# Patient Record
Sex: Female | Born: 1955 | Race: White | Hispanic: No | Marital: Single | Smoking: Former smoker
Health system: Southern US, Community
[De-identification: ages and names within clinical notes are randomized; demographics above are authoritative.]

## PROBLEM LIST (undated history)

## (undated) DIAGNOSIS — I10 Essential (primary) hypertension: Secondary | ICD-10-CM

## (undated) DIAGNOSIS — R7301 Impaired fasting glucose: Secondary | ICD-10-CM

## (undated) DIAGNOSIS — I739 Peripheral vascular disease, unspecified: Secondary | ICD-10-CM

## (undated) DIAGNOSIS — E7800 Pure hypercholesterolemia, unspecified: Secondary | ICD-10-CM

## (undated) HISTORY — DX: Peripheral vascular disease, unspecified: I73.9

## (undated) HISTORY — DX: Impaired fasting glucose: R73.01

---

## 1996-11-03 HISTORY — PX: HIP FRACTURE SURGERY: SHX118

## 2005-01-19 ENCOUNTER — Emergency Department (HOSPITAL_COMMUNITY): Admission: EM | Admit: 2005-01-19 | Discharge: 2005-01-19 | Payer: Self-pay | Admitting: Family Medicine

## 2010-03-20 ENCOUNTER — Emergency Department (HOSPITAL_COMMUNITY): Admission: EM | Admit: 2010-03-20 | Discharge: 2010-03-20 | Payer: Self-pay | Admitting: Family Medicine

## 2012-07-15 ENCOUNTER — Other Ambulatory Visit (HOSPITAL_COMMUNITY): Payer: Self-pay | Admitting: Family Medicine

## 2012-07-15 DIAGNOSIS — Z1231 Encounter for screening mammogram for malignant neoplasm of breast: Secondary | ICD-10-CM

## 2012-07-29 ENCOUNTER — Ambulatory Visit (HOSPITAL_COMMUNITY)
Admission: RE | Admit: 2012-07-29 | Discharge: 2012-07-29 | Disposition: A | Discharge status: Home / Self Care | Payer: Self-pay | Source: Ambulatory Visit | Attending: Family Medicine | Admitting: Family Medicine

## 2012-07-29 DIAGNOSIS — Z1231 Encounter for screening mammogram for malignant neoplasm of breast: Secondary | ICD-10-CM

## 2013-07-11 ENCOUNTER — Other Ambulatory Visit (HOSPITAL_COMMUNITY): Payer: Self-pay | Admitting: Family Medicine

## 2013-07-25 ENCOUNTER — Other Ambulatory Visit (HOSPITAL_COMMUNITY): Payer: Self-pay | Admitting: Family Medicine

## 2013-07-25 DIAGNOSIS — Z1231 Encounter for screening mammogram for malignant neoplasm of breast: Secondary | ICD-10-CM

## 2013-08-04 ENCOUNTER — Ambulatory Visit (HOSPITAL_COMMUNITY): Payer: Self-pay

## 2013-08-16 ENCOUNTER — Ambulatory Visit (HOSPITAL_COMMUNITY)
Admission: RE | Admit: 2013-08-16 | Discharge: 2013-08-16 | Disposition: A | Discharge status: Home / Self Care | Payer: Self-pay | Source: Ambulatory Visit | Attending: Family Medicine | Admitting: Family Medicine

## 2013-08-16 DIAGNOSIS — Z1231 Encounter for screening mammogram for malignant neoplasm of breast: Secondary | ICD-10-CM

## 2014-07-24 ENCOUNTER — Other Ambulatory Visit (HOSPITAL_COMMUNITY): Payer: Self-pay | Admitting: Family Medicine

## 2014-08-21 ENCOUNTER — Other Ambulatory Visit (HOSPITAL_COMMUNITY): Payer: Self-pay | Admitting: Family Medicine

## 2014-08-21 DIAGNOSIS — Z1231 Encounter for screening mammogram for malignant neoplasm of breast: Secondary | ICD-10-CM

## 2014-08-29 ENCOUNTER — Ambulatory Visit (HOSPITAL_COMMUNITY)
Admission: RE | Admit: 2014-08-29 | Discharge: 2014-08-29 | Disposition: A | Discharge status: Home / Self Care | Payer: Self-pay | Source: Ambulatory Visit | Attending: Family Medicine | Admitting: Family Medicine

## 2014-08-29 DIAGNOSIS — Z1231 Encounter for screening mammogram for malignant neoplasm of breast: Secondary | ICD-10-CM

## 2015-09-04 ENCOUNTER — Other Ambulatory Visit: Payer: Self-pay

## 2015-09-04 DIAGNOSIS — Z1231 Encounter for screening mammogram for malignant neoplasm of breast: Secondary | ICD-10-CM

## 2015-09-24 ENCOUNTER — Ambulatory Visit
Admission: RE | Admit: 2015-09-24 | Discharge: 2015-09-24 | Disposition: A | Discharge status: Home / Self Care | Payer: 59 | Source: Ambulatory Visit

## 2015-09-24 DIAGNOSIS — Z1231 Encounter for screening mammogram for malignant neoplasm of breast: Secondary | ICD-10-CM

## 2016-07-29 DIAGNOSIS — Z23 Encounter for immunization: Secondary | ICD-10-CM | POA: Diagnosis not present

## 2016-07-29 DIAGNOSIS — H539 Unspecified visual disturbance: Secondary | ICD-10-CM | POA: Diagnosis not present

## 2016-07-29 DIAGNOSIS — F17200 Nicotine dependence, unspecified, uncomplicated: Secondary | ICD-10-CM | POA: Diagnosis not present

## 2016-07-29 DIAGNOSIS — E7800 Pure hypercholesterolemia, unspecified: Secondary | ICD-10-CM | POA: Diagnosis not present

## 2016-07-29 DIAGNOSIS — I10 Essential (primary) hypertension: Secondary | ICD-10-CM | POA: Diagnosis not present

## 2016-08-26 ENCOUNTER — Other Ambulatory Visit: Payer: Self-pay | Admitting: Family Medicine

## 2016-08-26 DIAGNOSIS — Z1231 Encounter for screening mammogram for malignant neoplasm of breast: Secondary | ICD-10-CM

## 2016-09-24 ENCOUNTER — Ambulatory Visit: Payer: Self-pay

## 2016-10-21 ENCOUNTER — Ambulatory Visit
Admission: RE | Admit: 2016-10-21 | Discharge: 2016-10-21 | Disposition: A | Discharge status: Home / Self Care | Payer: Self-pay | Source: Ambulatory Visit | Attending: Family Medicine | Admitting: Family Medicine

## 2016-10-21 DIAGNOSIS — Z1231 Encounter for screening mammogram for malignant neoplasm of breast: Secondary | ICD-10-CM

## 2016-12-04 ENCOUNTER — Telehealth: Payer: Self-pay | Admitting: Acute Care

## 2016-12-05 NOTE — Telephone Encounter (Signed)
Will forward to the lung screening pool 

## 2016-12-30 NOTE — Telephone Encounter (Signed)
Per referral note 12/04/16 letters was sent to Dr Doran Stabler to notify that referral has been cancelled due to unable to contact pt to schedule appt.  A new referral will need to be placed if he still wants pt to be seen.

## 2017-07-29 DIAGNOSIS — E7800 Pure hypercholesterolemia, unspecified: Secondary | ICD-10-CM | POA: Diagnosis not present

## 2017-07-29 DIAGNOSIS — I10 Essential (primary) hypertension: Secondary | ICD-10-CM | POA: Diagnosis not present

## 2017-07-29 DIAGNOSIS — F17200 Nicotine dependence, unspecified, uncomplicated: Secondary | ICD-10-CM | POA: Diagnosis not present

## 2018-07-30 DIAGNOSIS — I10 Essential (primary) hypertension: Secondary | ICD-10-CM | POA: Diagnosis not present

## 2018-07-30 DIAGNOSIS — Z1211 Encounter for screening for malignant neoplasm of colon: Secondary | ICD-10-CM | POA: Diagnosis not present

## 2018-07-30 DIAGNOSIS — E7800 Pure hypercholesterolemia, unspecified: Secondary | ICD-10-CM | POA: Diagnosis not present

## 2018-07-30 DIAGNOSIS — F17200 Nicotine dependence, unspecified, uncomplicated: Secondary | ICD-10-CM | POA: Diagnosis not present

## 2019-05-09 ENCOUNTER — Other Ambulatory Visit: Payer: Self-pay | Admitting: Family Medicine

## 2019-05-09 DIAGNOSIS — Z1231 Encounter for screening mammogram for malignant neoplasm of breast: Secondary | ICD-10-CM

## 2019-06-10 ENCOUNTER — Other Ambulatory Visit: Payer: Self-pay

## 2019-06-10 ENCOUNTER — Encounter (HOSPITAL_COMMUNITY): Payer: Self-pay | Admitting: Emergency Medicine

## 2019-06-10 ENCOUNTER — Ambulatory Visit (HOSPITAL_COMMUNITY)
Admission: EM | Admit: 2019-06-10 | Discharge: 2019-06-10 | Disposition: A | Discharge status: Home / Self Care | Payer: Medicare Other | Attending: Family Medicine | Admitting: Family Medicine

## 2019-06-10 DIAGNOSIS — R1013 Epigastric pain: Secondary | ICD-10-CM

## 2019-06-10 HISTORY — DX: Essential (primary) hypertension: I10

## 2019-06-10 HISTORY — DX: Pure hypercholesterolemia, unspecified: E78.00

## 2019-06-10 LAB — COMPREHENSIVE METABOLIC PANEL
ALT: 15 U/L (ref 0–44)
AST: 20 U/L (ref 15–41)
Albumin: 4.4 g/dL (ref 3.5–5.0)
Alkaline Phosphatase: 67 U/L (ref 38–126)
Anion gap: 13 (ref 5–15)
BUN: 12 mg/dL (ref 8–23)
CO2: 26 mmol/L (ref 22–32)
Calcium: 9.7 mg/dL (ref 8.9–10.3)
Chloride: 96 mmol/L — ABNORMAL LOW (ref 98–111)
Creatinine, Ser: 0.77 mg/dL (ref 0.44–1.00)
GFR calc Af Amer: >60 mL/min (ref >60)
GFR calc non Af Amer: >60 mL/min (ref >60)
Glucose, Bld: 103 mg/dL — ABNORMAL HIGH (ref 70–99)
Potassium: 4.1 mmol/L (ref 3.5–5.1)
Sodium: 135 mmol/L (ref 135–145)
Total Bilirubin: 1 mg/dL (ref 0.3–1.2)
Total Protein: 7.2 g/dL (ref 6.5–8.1)

## 2019-06-10 LAB — CBC
HCT: 37.4 % (ref 36.0–46.0)
Hemoglobin: 12.4 g/dL (ref 12.0–15.0)
MCH: 29.9 pg (ref 26.0–34.0)
MCHC: 33.2 g/dL (ref 30.0–36.0)
MCV: 90.1 fL (ref 80.0–100.0)
Platelets: 233 10*3/uL (ref 150–400)
RBC: 4.15 MIL/uL (ref 3.87–5.11)
RDW: 12.9 % (ref 11.5–15.5)
WBC: 11.2 10*3/uL — ABNORMAL HIGH (ref 4.0–10.5)
nRBC: 0 % (ref 0.0–0.2)

## 2019-06-10 LAB — LIPASE, BLOOD: Lipase: 30 U/L (ref 11–51)

## 2019-06-10 MED ORDER — LIDOCAINE VISCOUS HCL 2 % MT SOLN
15.0000 mL | Freq: Once | OROMUCOSAL | Status: AC
Start: 2019-06-10 — End: 2019-06-10
  Administered 2019-06-10: 15 mL via ORAL

## 2019-06-10 MED ORDER — ALUM & MAG HYDROXIDE-SIMETH 200-200-20 MG/5ML PO SUSP
30.0000 mL | Freq: Once | ORAL | Status: AC
  Administered 2019-06-10: 30 mL via ORAL

## 2019-06-10 MED ORDER — LIDOCAINE VISCOUS HCL 2 % MT SOLN
OROMUCOSAL | Status: AC
  Filled 2019-06-10: qty 15

## 2019-06-10 MED ORDER — OMEPRAZOLE 20 MG PO CPDR: 20.0000 mg | DELAYED_RELEASE_CAPSULE | Freq: Every day | ORAL | 0 refills | Status: DC

## 2019-06-10 MED ORDER — ALUM & MAG HYDROXIDE-SIMETH 200-200-20 MG/5ML PO SUSP
ORAL | Status: AC
  Filled 2019-06-10: qty 30

## 2019-06-10 NOTE — Discharge Instructions (Signed)
Begin omeprazole daily for the next 1-2 weeks I will call if blood work abnormal Please contact primary care to discuss setting up outpatient ultrasound to evaluate your gallbladder If pain worsening developing fever or vomiting follow up in the emergency room

## 2019-06-10 NOTE — ED Triage Notes (Signed)
Pt sts takes 3 htn meds and 1 medication for cholesterol but doesn't know their names or have a list with here today

## 2019-06-10 NOTE — ED Triage Notes (Signed)
Pt here for abd pain x several days; pt thinks may be since starting taking new htn meds

## 2019-06-10 NOTE — ED Provider Notes (Signed)
[facility]    CSN: [id number] Arrival date & time: 06/10/19  1035     History   Chief Complaint Chief Complaint  Patient presents with  . Abdominal Pain    HPI Kathleen Moyer is a 63 y.o. female history of hypertension, hyperlipidemia presenting today for evaluation of abdominal pain.  Patient has had abdominal pain over the past 2 to 3 days.  Pain is located in her epigastrium.  Patient will radiate to the back and wakes her up out of sleep.  Pain is intermittent.  Pain currently at time of visit is 7 out of 10, at nighttime she reports it being 10 out of 10.  She denies associated nausea or vomiting.  Denies fevers.  Denies change in bowels including denying diarrhea or constipation.  Has daily regular bowel movements, skipped yesterday, but had a normal bowel movement this morning.  Denies associated chest pain or shortness of breath.  Denies associated URI symptoms of cough, congestion or sore throat.  Denies previous abdominal surgeries.  Patient denies frequent use of NSAIDs.  Denies regular alcohol intake.  She does note that she recently had alterations in her blood pressure medicine and was unsure if this was related to the medicine.  She does not know the names of the medicine she is on or were recently changed.  HPI  Past Medical History:  Diagnosis Date  . Hypercholesteremia   . Hypertension     There are no active problems to display for this patient.   History reviewed. No pertinent surgical history.  OB History   No obstetric history on file.      Home Medications    Prior to Admission medications   Medication Sig Start Date End Date Taking? Authorizing Provider  omeprazole (PRILOSEC) 20 MG capsule Take 1 capsule (20 mg total) by mouth daily. 06/10/19   Wieters, Elesa Hacker, PA-C    Family History No family history on file.  Social History Social History   Tobacco Use  . Smoking status: Current Every Day Smoker  . Smokeless tobacco: Never Used   Substance Use Topics  . Alcohol use: Yes    Frequency: Never  . Drug use: Never     Allergies   Patient has no known allergies.   Review of Systems Review of Systems  Constitutional: Negative for activity change, appetite change, chills, fatigue and fever.  HENT: Negative for congestion, ear pain, rhinorrhea, sinus pressure, sore throat and trouble swallowing.   Eyes: Negative for discharge and redness.  Respiratory: Negative for cough, chest tightness and shortness of breath.   Cardiovascular: Negative for chest pain.  Gastrointestinal: Positive for abdominal pain. Negative for diarrhea, nausea and vomiting.  Musculoskeletal: Negative for myalgias.  Skin: Negative for rash.  Neurological: Negative for dizziness, light-headedness and headaches.     Physical Exam Triage Vital Signs ED Triage Vitals [06/10/19 1158]  Enc Vitals Group     BP (!) 142/76     Pulse Rate 60     Resp 18     Temp 98.3 F (36.8 C)     Temp Source Oral     SpO2 98 %     Weight      Height      Head Circumference      Peak Flow      Pain Score 6     Pain Loc      Pain Edu?      Excl. in [place]?    No  data found.  Updated Vital Signs BP (!) 142/76 (BP Location: Right Arm)   Pulse 60   Temp 98.3 F (36.8 C) (Oral)   Resp 18   SpO2 98%   Visual Acuity Right Eye Distance:   Left Eye Distance:   Bilateral Distance:    Right Eye Near:   Left Eye Near:    Bilateral Near:     Physical Exam Vitals signs and nursing note reviewed.  Constitutional:      General: She is not in acute distress.    Appearance: She is well-developed.  HENT:     Head: Normocephalic and atraumatic.     Mouth/Throat:     Comments: Oral mucosa pink and moist, no tonsillar enlargement or exudate. Posterior pharynx patent and nonerythematous, no uvula deviation or swelling. Normal phonation. Eyes:     Conjunctiva/sclera: Conjunctivae normal.  Neck:     Musculoskeletal: Neck supple.  Cardiovascular:     Rate  and Rhythm: Normal rate and regular rhythm.     Heart sounds: No murmur.  Pulmonary:     Effort: Pulmonary effort is normal. No respiratory distress.     Breath sounds: Normal breath sounds.     Comments: Breathing comfortably at rest, CTABL, no wheezing, rales or other adventitious sounds auscultated Abdominal:     Palpations: Abdomen is soft.     Tenderness: There is abdominal tenderness.     Comments: Abdomen soft, nondistended, no previous surgical scars noted.  Tender to palpation in mid upper abdomen, epigastrium and right upper quadrant.  Negative rebound, negative Murphy's.  Skin:    General: Skin is warm and dry.  Neurological:     Mental Status: She is alert.      UC Treatments / Results  Labs (all labs ordered are listed, but only abnormal results are displayed) Labs Reviewed  CBC - Abnormal; Notable for the following components:      Result Value   WBC 11.2 (*)    All other components within normal limits  COMPREHENSIVE METABOLIC PANEL - Abnormal; Notable for the following components:   Chloride 96 (*)    Glucose, Bld 103 (*)    All other components within normal limits  LIPASE, BLOOD    EKG   Radiology No results found.  Procedures Procedures (including critical care time)  Medications Ordered in UC Medications  alum & mag hydroxide-simeth (MAALOX/MYLANTA) 200-200-20 MG/5ML suspension 30 mL (30 mLs Oral Given 06/10/19 1233)    And  lidocaine (XYLOCAINE) 2 % viscous mouth solution 15 mL (15 mLs Oral Given 06/10/19 1234)  alum & mag hydroxide-simeth (MAALOX/MYLANTA) 200-200-20 MG/5ML suspension (has no administration in time range)  lidocaine (XYLOCAINE) 2 % viscous mouth solution (has no administration in time range)    Initial Impression / Assessment and Plan / UC Course  I have reviewed the triage vital signs and the nursing notes.  Pertinent labs & imaging results that were available during my care of the patient were reviewed by me and considered in  my medical decision making (see chart for details).     Patient with upper abdominal tenderness.  GI cocktail provided with mild improvement in symptoms.  Patient symptoms concerning for gallbladder etiology given location right upper quadrant, awaking at nighttime.  Will initiate on omeprazole daily for the next 1 to 2 weeks.  Will obtain CBC, CMP and lipase, white count slightly elevated at 11.2.  Lipase normal.  Liver functions within normal limits.  Vital signs stable without fever or  tachycardia.  At this time do not suspect cholecystitis.  Will recommend outpatient follow-up with PCP for outpatient ultrasound of right upper quadrant.  Advised to follow-up in emergency room if pain worsening or progressing.Discussed strict return precautions. Patient verbalized understanding and is agreeable with plan.  Final Clinical Impressions(s) / UC Diagnoses   Final diagnoses:  Epigastric pain     Discharge Instructions     Begin omeprazole daily for the next 1-2 weeks I will call if blood work abnormal Please contact primary care to discuss setting up outpatient ultrasound to evaluate your gallbladder If pain worsening developing fever or vomiting follow up in the emergency room     ED Prescriptions    Medication Sig Dispense Auth. Provider   omeprazole (PRILOSEC) 20 MG capsule Take 1 capsule (20 mg total) by mouth daily. 14 capsule Wieters, Hallie C, PA-C     Controlled Substance Prescriptions [place] Controlled Substance Registry consulted? Not Applicable   Janith Lima, [place] 06/10/19 1519

## 2019-06-13 ENCOUNTER — Other Ambulatory Visit: Payer: Self-pay | Admitting: Family Medicine

## 2019-06-13 ENCOUNTER — Telehealth (HOSPITAL_COMMUNITY): Payer: Self-pay | Admitting: Emergency Medicine

## 2019-06-13 DIAGNOSIS — R109 Unspecified abdominal pain: Secondary | ICD-10-CM

## 2019-06-13 NOTE — Telephone Encounter (Signed)
Normal, Patient contacted and made aware of    results, all questions answered Has pcp appt on Wednesday for ultrasound.

## 2019-06-15 ENCOUNTER — Ambulatory Visit
Admission: RE | Admit: 2019-06-15 | Discharge: 2019-06-15 | Disposition: A | Discharge status: Home / Self Care | Payer: Medicare Other | Source: Ambulatory Visit | Attending: Family Medicine | Admitting: Family Medicine

## 2019-06-15 DIAGNOSIS — R109 Unspecified abdominal pain: Secondary | ICD-10-CM

## 2019-06-22 ENCOUNTER — Ambulatory Visit
Admission: RE | Admit: 2019-06-22 | Discharge: 2019-06-22 | Disposition: A | Discharge status: Home / Self Care | Payer: Medicare Other | Source: Ambulatory Visit | Attending: Family Medicine | Admitting: Family Medicine

## 2019-06-22 ENCOUNTER — Other Ambulatory Visit: Payer: Self-pay

## 2019-06-22 DIAGNOSIS — Z1231 Encounter for screening mammogram for malignant neoplasm of breast: Secondary | ICD-10-CM

## 2019-08-29 ENCOUNTER — Encounter: Payer: Self-pay | Admitting: Cardiology

## 2019-08-30 ENCOUNTER — Ambulatory Visit (INDEPENDENT_AMBULATORY_CARE_PROVIDER_SITE_OTHER): Payer: Medicare Other | Admitting: Cardiology

## 2019-08-30 ENCOUNTER — Other Ambulatory Visit: Payer: Self-pay

## 2019-08-30 ENCOUNTER — Encounter: Payer: Self-pay | Admitting: Cardiology

## 2019-08-30 VITALS — BP 180/70 | HR 53 | Ht 63.0 in | Wt 125.8 lb

## 2019-08-30 DIAGNOSIS — Z79899 Other long term (current) drug therapy: Secondary | ICD-10-CM

## 2019-08-30 DIAGNOSIS — I10 Essential (primary) hypertension: Secondary | ICD-10-CM

## 2019-08-30 DIAGNOSIS — E7800 Pure hypercholesterolemia, unspecified: Secondary | ICD-10-CM | POA: Diagnosis not present

## 2019-08-30 MED ORDER — SPIRONOLACTONE 25 MG PO TABS: 25.0000 mg | ORAL_TABLET | Freq: Every day | ORAL | 3 refills | Status: DC
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
## 2019-08-30 NOTE — Patient Instructions (Signed)
Medication Instructions:  Please start Spironolactone 25 mg once daily.  Continue all other medications as listed.  *If you need a refill on your cardiac medications before your next appointment, please call your pharmacy*  Lab Work: Please have blood work in approximately 1 week (BMP) If you have labs (blood work) drawn today and your tests are completely normal, you will receive your results only by: Marland Kitchen MyChart Message (if you have MyChart) OR . A paper copy in the mail If you have any lab test that is abnormal or we need to change your treatment, we will call you to review the results.  Testing/Procedures: Your physician has requested that you have a renal artery duplex. During this test, an ultrasound is used to evaluate blood flow to the kidneys. Allow one hour for this exam. Do not eat after midnight the day before and avoid carbonated beverages. Take your medications as you usually do.  You have been referred to the Hypertension Clinic.  Please schedule to establish with them 1 approximately 1 week.  Follow-Up: Follow up with Dr Marlou Porch will be determined after being treated in the Hypertension Clinic.  Thank you for choosing [facility]!!

## 2019-09-06 ENCOUNTER — Other Ambulatory Visit: Payer: Self-pay

## 2019-09-06 ENCOUNTER — Other Ambulatory Visit: Payer: Medicare Other

## 2019-09-06 ENCOUNTER — Ambulatory Visit: Payer: Medicare Other | Admitting: Pharmacist

## 2019-09-06 DIAGNOSIS — Z79899 Other long term (current) drug therapy: Secondary | ICD-10-CM

## 2019-09-06 DIAGNOSIS — I10 Essential (primary) hypertension: Secondary | ICD-10-CM

## 2019-09-06 LAB — BASIC METABOLIC PANEL
BUN/Creatinine Ratio: 17 (ref 12–28)
BUN: 14 mg/dL (ref 8–27)
CO2: 25 mmol/L (ref 20–29)
Calcium: 10.2 mg/dL (ref 8.7–10.3)
Chloride: 97 mmol/L (ref 96–106)
Creatinine, Ser: 0.82 mg/dL (ref 0.57–1.00)
GFR calc Af Amer: 87 mL/min/{1.73_m2} (ref >59)
GFR calc non Af Amer: 75 mL/min/{1.73_m2} (ref >59)
Glucose: 127 mg/dL — ABNORMAL HIGH (ref 65–99)
Potassium: 4.7 mmol/L (ref 3.5–5.2)
Sodium: 137 mmol/L (ref 134–144)

## 2019-09-06 NOTE — Progress Notes (Unsigned)
Patient ID: Kathleen Moyer                 DOB: October 01, 1956                      MRN: [id number]     HPI: Kathleen Moyer is a 63 y.o. female newly referred by Dr. Marlou Porch to HTN clinic. Started care with Dr. Marlou Porch last week. PMH is significant for HTN, HLD.   BP uncontrolled at last physician visit (10/27) despite 4 HTN medications. Side effects with previous medications; including abdominal pain with valsartan, and feet swelling with amlodipine (trialed twice). Lisinopril reportedly stopped for ineffectiveness in 2019.  Spironolact one recently started by Dr. Marlou Porch (10/27). BMP ordered prior to today's visit. Previously noted BPs: 180/70, 168/72, 142/76, but home BP average in July reportedly 155/80.  Scheduled for renal duplex tomorrow (11/4) to determine if HTN is of renovascular nature.   Tolerating spironolactone? Labs?  Abdominal pain with valsartan?  Increase spironolactone or hydralazine?   Current HTN meds: atenolol-chlorthalidone 100-25 mg daily, doxazosin 2 mg daily, hydralazine 25 mg twice a day, spironolactone 25 mg daily.  Previously tried: valsartan (abdominal pain), amlodipine (foot edema), lisinopril (infeffective).   BP goal: <130/80  Family History: CVA (father), HTN (brother, father),   Social History: smoker (<0.5 packs/day)  Diet:   Exercise:   Home BP readings:   Wt Readings from Last 3 Encounters:  08/30/19 125 lb 12.8 oz (57.1 kg)   BP Readings from Last 3 Encounters:  08/30/19 (!) 180/70  06/10/19 (!) 142/76   Pulse Readings from Last 3 Encounters:  08/30/19 (!) 53  06/10/19 60    Renal function: CrCl cannot be calculated (Patient's most recent lab result is older than the maximum 21 days allowed.).  Past Medical History:  Diagnosis Date  . Elevated fasting glucose   . Hypercholesteremia   . Hypertension     Current Outpatient Medications on File Prior to Visit  Medication Sig Dispense Refill  . atenolol-chlorthalidone (TENORETIC) 100-25 MG  tablet Take 1 tablet by mouth daily.    Marland Kitchen doxazosin (CARDURA) 2 MG tablet Take 2 mg by mouth daily.    . hydrALAZINE (APRESOLINE) 25 MG tablet Take 25 mg by mouth 2 (two) times daily.    . naproxen sodium (ALEVE) 220 MG tablet Take 220 mg by mouth 2 (two) times daily as needed. Take 2 tablets by mouth BID with food for pain and inflammation as needed    . omeprazole (PRILOSEC) 20 MG capsule Take 1 capsule (20 mg total) by mouth daily. 14 capsule 0  . simvastatin (ZOCOR) 40 MG tablet Take 40 mg by mouth daily.    Marland Kitchen spironolactone (ALDACTONE) 25 MG tablet Take 1 tablet (25 mg total) by mouth daily. 90 tablet 3   No current facility-administered medications on file prior to visit.     Allergies  Allergen Reactions  . Indocin [Indomethacin] Diarrhea and Nausea Only     Assessment/Plan:  1. Hypertension -

## 2019-09-07 ENCOUNTER — Telehealth: Payer: Self-pay | Admitting: *Deleted

## 2019-09-07 ENCOUNTER — Encounter (HOSPITAL_COMMUNITY): Payer: Medicare Other

## 2019-09-07 ENCOUNTER — Ambulatory Visit (HOSPITAL_COMMUNITY)
Admission: RE | Admit: 2019-09-07 | Discharge: 2019-09-07 | Disposition: A | Discharge status: Home / Self Care | Payer: Medicare Other | Source: Ambulatory Visit | Attending: Cardiology | Admitting: Cardiology

## 2019-09-07 DIAGNOSIS — I10 Essential (primary) hypertension: Secondary | ICD-10-CM | POA: Insufficient documentation

## 2019-09-07 DIAGNOSIS — Z79899 Other long term (current) drug therapy: Secondary | ICD-10-CM

## 2019-09-07 NOTE — Telephone Encounter (Signed)
Left message on VM to c/b to discuss the information below:   Notes recorded by Jerline Pain, MD on 09/07/2019 at 3:40 PM EST  Lets add telmisartan 40 mg once a day.  Repeat basic metabolic profile in 2 weeks.  Lets also set her up with the hypertension clinic in 2 weeks as well.  Candee Furbish, MD   Notes recorded by Antonieta Iba, RN on 09/07/2019 at 8:24 AM EST  The patient has been notified of the result and verbalized understanding.  BP Readings:  11/02: 167/69  11/03: 174/75  11/04: 161/74   This information can also be found on pt's lab results as well.

## 2019-09-08 MED ORDER — TELMISARTAN 40 MG PO TABS: 40.0000 mg | ORAL_TABLET | Freq: Every day | ORAL | 11 refills | Status: DC

## 2019-09-08 NOTE — Telephone Encounter (Signed)
Follow Up:   Pt is returning Pam Fleming's call from yesterday.

## 2019-09-08 NOTE — Telephone Encounter (Signed)
Pt aware of recommendations and agrees with plan Telmisartan 40 mg sent via epic to pt's pharmacy [facility]

## 2019-09-09 NOTE — Telephone Encounter (Signed)
Sister of the patient wanted clarification on the medications she is supposed to be taking. The sister is not sure if she is supposed to be taking the new medication in addition to all of the previously prescribed medications, or if the new medication will replace the old medications. The sister asked the patient and the patient forgot what the office had to say. Please call the sister for more clarification.

## 2019-09-13 NOTE — Telephone Encounter (Signed)
Called and spoke with pt regarding her sister's call and request for information.  Advised pt we do not have signed paperwork on file giving me permission to discuss pt's care with her.  Pt reports she does not have any questions.  Her sister was calling b/c she questions the necessary number of medication pt is having to take in order to control her blood pressure.  Pt reports she understands how to take her medications and her BP has been improving since starting Telmisartan daily.  11/8 BP was 131/66 and today was 147/71.  She will continue medications as ordered and f/u as scheduled.  She does have a DPR which has been filled out and will bring to her next appt.

## 2019-09-21 NOTE — Patient Instructions (Addendum)
Thank you for visiting [place] today!  Your blood pressure goal is <130/80 mmHg. Your blood pressure today was 145/70.  Medication Changes:  If your lab results look good today, we will increase your telmisartan to 80mg  once daily (you can take two of your 40mg  tablets until you pick up a new prescription for 80mg  tablets).  --We will call you with the results today or tomorrow.   Continue Atenolol-chlorthalidone (Tenoretic) 100-25 mg daily, Doxazosin 2 mg daily, Hydralazine 25 mg BID, Spironolactone 25 mg daily.   Continue to check your blood pressures at home at least 1-2 hours after you have taken you morning blood pressure medications.   Keep up the good work with diet and exercise. Try to increase walking to 15 minutes daily.   If you have any questions or concerns, please give [place] a call at [phone].

## 2019-09-21 NOTE — Progress Notes (Signed)
Patient ID: Kathleen Moyer                 DOB: 19-Feb-1956                      MRN: [id number]     HPI: Kathleen Moyer is a 63 y.o. female patient of Dr. Marisue Humble, referred by Dr. Marlou Porch to HTN clinic. PMH is significant for HTN, HLD, and tobacco abuse. She presented to the ED on 06/10/19 complaining of abdominal pain (7/10 during day, 10/10 at night) for several days, which she attributed to starting new HTN medications. Her BP then was 142/76, and HR was 60. She was ultimately discharged with no changes, and followed up with her PCP (Dr. Marisue Humble) who referred her to Dr. Marlou Porch. She saw Dr. Marlou Porch on 08/30/2019. He noted her last BP was 168/72, with averages in July of 155/80, however, she presented with BP of 180/70 and HR of 53. An EKG was ordered which showed sinus bradycardia but no other abnormalities. During that visit she was started on spironolactone 25 mg daily. However, on 11/4 via phone f/u, her home BP was noted to be elevated (167/69, 174/75, 161/74) and Dr. Marlou Porch added telmisartan 40 mg daily as well. A renal artery duplex was done on 11/4 to rule out secondary HTN, and found no stenosis or abnormalities.   Patient spoken with over the phone on 11/10 in regards to a call by her sister on 11/6 who was questioning which medications the patient should actually be taking for her BP, as it appears Ms. Bernarda Caffey was unsure what the directions were following the 11/4 phone call. The patient reported her BP had been improving since starting telmisartan daily, with a BP of 131/66 on 11/8, and 147/71 on 11/10.   Today, patient presents to clinic in good spirits. She provided home BP log (below), and values show improvements but still above goal. When she checks her home BP, she does it early in the morning before she takes her BP medications. She reports tolerating her medications well, including her new telmisartan. Denies any swelling, SOB, dizziness, or chest pain. She was able to correctly articulate  which medications she was taking and when. Her NSAID use is very infrequent, but has it in case of pain from hip bursitis issue. Her BP initially was 142/70, and 145/70 on recheck. Patient will be going for labs today, and preferred for [place] to schedule f/u when we call with results.  Current HTN meds:  Atenolol-chlorthalidone (Tenoretic) 100-25 mg daily (AM) Doxazosin 2 mg daily (PM) Hydralazine 25 mg BID (AM and PM) Spironolactone 25 mg daily (PM) Telmisartan 40 mg daily (PM)  Previously tried:  Valsartan 320mg  (abdominal pain) Amlodipine (feet swelling)  Lisinopril (ineffective in 2019)  BP goal: <130/80 mmHg  Family History: CVA in her father; Hypertension in her brother and father; Other in her mother. There is no history of Colon cancer or Liver disease.  Social History: Smoker (<1/2 pack/day)  Diet: Breakfast - eggs, bacon (sometimes), ham. Coffee (2-3 cups). Lunch - usually skips lunch) Supper - chicken nuggets, salads, not a lot of meat, doesn't cook a lot but doesn't eat out. No soda, mostly water.   Exercise: Not as active since retirement (2018), does walk through the house and outside.   Home BP readings: 414/67, 131/66, 158/69, 147/71, 140/67, 140/65, 134/66, 134/66, 127/67, 143/69, 147/70, 153/70. 145/69  Wt Readings from Last 3 Encounters:  08/30/19 125 lb 12.8 oz (57.1  kg)   BP Readings from Last 3 Encounters:  08/30/19 (!) 180/70  06/10/19 (!) 142/76   Pulse Readings from Last 3 Encounters:  08/30/19 (!) 53  06/10/19 60    Renal function: CrCl cannot be calculated (Unknown ideal weight.).  Past Medical History:  Diagnosis Date   Elevated fasting glucose    Hypercholesteremia    Hypertension     Current Outpatient Medications on File Prior to Visit  Medication Sig Dispense Refill   atenolol-chlorthalidone (TENORETIC) 100-25 MG tablet Take 1 tablet by mouth daily.     doxazosin (CARDURA) 2 MG tablet Take 2 mg by mouth daily.     hydrALAZINE  (APRESOLINE) 25 MG tablet Take 25 mg by mouth 2 (two) times daily.     naproxen sodium (ALEVE) 220 MG tablet Take 220 mg by mouth 2 (two) times daily as needed. Take 2 tablets by mouth BID with food for pain and inflammation as needed     omeprazole (PRILOSEC) 20 MG capsule Take 1 capsule (20 mg total) by mouth daily. 14 capsule 0   simvastatin (ZOCOR) 40 MG tablet Take 40 mg by mouth daily.     spironolactone (ALDACTONE) 25 MG tablet Take 1 tablet (25 mg total) by mouth daily. 90 tablet 3   telmisartan (MICARDIS) 40 MG tablet Take 1 tablet (40 mg total) by mouth daily. 30 tablet 11   No current facility-administered medications on file prior to visit.     Allergies  Allergen Reactions   Indocin [Indomethacin] Diarrhea and Nausea Only     Assessment/Plan:  1. Hypertension - Patient's blood pressure remains above goal of <130/80 mmHg, warranting additional medication optimization. Given tolerance to recent telmisartan addition, plan to increase telmisartan to 80 mg daily if BMET today is normal. Continue atenolol-chlorthalidone (Tenoretic) 100-25 mg daily (AM), doxazosin 2 mg daily (PM), hydralazine 25 mg BID (AM and PM), and spironolactone 25 mg daily (PM). Advised pt to continue checking blood pressure at home daily. Patient counseled on proper technique and timing relative to when she takes her BP medications. She was advised on ways to reduce salt intake and to reduce daily caffeine intake. Plan to call patient with results of her lab work and determine follow-up appointment at that time.   Claudina Lick, PharmD Candidate [phone] [place] [id number] [address]. [address], [place] [address] Phone: [phone]; Fax: [phone]

## 2019-09-22 ENCOUNTER — Ambulatory Visit (INDEPENDENT_AMBULATORY_CARE_PROVIDER_SITE_OTHER): Payer: Medicare Other | Admitting: Pharmacist

## 2019-09-22 ENCOUNTER — Telehealth: Payer: Self-pay | Admitting: Pharmacist

## 2019-09-22 ENCOUNTER — Other Ambulatory Visit: Payer: Self-pay

## 2019-09-22 ENCOUNTER — Other Ambulatory Visit: Payer: Medicare Other | Admitting: *Deleted

## 2019-09-22 VITALS — BP 142/70 | HR 53

## 2019-09-22 DIAGNOSIS — Z79899 Other long term (current) drug therapy: Secondary | ICD-10-CM

## 2019-09-22 DIAGNOSIS — I10 Essential (primary) hypertension: Secondary | ICD-10-CM | POA: Diagnosis not present

## 2019-09-22 LAB — BASIC METABOLIC PANEL
Anion gap: 10 (ref 5–15)
BUN: 10 mg/dL (ref 8–23)
CO2: 27 mmol/L (ref 22–32)
Calcium: 9.6 mg/dL (ref 8.9–10.3)
Chloride: 98 mmol/L (ref 98–111)
Creatinine, Ser: 0.7 mg/dL (ref 0.44–1.00)
GFR calc Af Amer: >60 mL/min (ref >60)
GFR calc non Af Amer: >60 mL/min (ref >60)
Glucose, Bld: 135 mg/dL — ABNORMAL HIGH (ref 70–99)
Potassium: 4.2 mmol/L (ref 3.5–5.1)
Sodium: 135 mmol/L (ref 135–145)

## 2019-09-22 MED ORDER — TELMISARTAN 80 MG PO TABS: 80.0000 mg | ORAL_TABLET | Freq: Every day | ORAL | 11 refills | Status: DC

## 2019-09-22 NOTE — Telephone Encounter (Signed)
BMET stable, will increase telmisartan to 80mg  daily and continue other HTN medications. Pt is aware of med change. Will f/u in HTN clinic in 3 weeks.

## 2019-10-13 ENCOUNTER — Ambulatory Visit (INDEPENDENT_AMBULATORY_CARE_PROVIDER_SITE_OTHER): Payer: Medicare Other | Admitting: Pharmacist

## 2019-10-13 ENCOUNTER — Other Ambulatory Visit: Payer: Self-pay

## 2019-10-13 ENCOUNTER — Encounter (INDEPENDENT_AMBULATORY_CARE_PROVIDER_SITE_OTHER): Payer: Self-pay

## 2019-10-13 VITALS — BP 152/68 | HR 58

## 2019-10-13 DIAGNOSIS — I10 Essential (primary) hypertension: Secondary | ICD-10-CM

## 2019-10-13 LAB — BASIC METABOLIC PANEL
BUN/Creatinine Ratio: 23 (ref 12–28)
BUN: 19 mg/dL (ref 8–27)
CO2: 24 mmol/L (ref 20–29)
Calcium: 10.1 mg/dL (ref 8.7–10.3)
Chloride: 97 mmol/L (ref 96–106)
Creatinine, Ser: 0.82 mg/dL (ref 0.57–1.00)
GFR calc Af Amer: 87 mL/min/{1.73_m2} (ref >59)
GFR calc non Af Amer: 75 mL/min/{1.73_m2} (ref >59)
Glucose: 117 mg/dL — ABNORMAL HIGH (ref 65–99)
Potassium: 4.2 mmol/L (ref 3.5–5.2)
Sodium: 137 mmol/L (ref 134–144)

## 2019-10-13 MED ORDER — HYDRALAZINE HCL 50 MG PO TABS: 50.0000 mg | ORAL_TABLET | Freq: Three times a day (TID) | ORAL | 11 refills | Status: DC

## 2019-10-13 MED ORDER — ATENOLOL-CHLORTHALIDONE 100-25 MG PO TABS: 1.0000 | ORAL_TABLET | Freq: Every day | ORAL | 3 refills | Status: DC

## 2019-10-13 MED ORDER — TELMISARTAN 80 MG PO TABS: 80.0000 mg | ORAL_TABLET | Freq: Every day | ORAL | 3 refills | Status: DC

## 2019-10-13 MED ORDER — SIMVASTATIN 40 MG PO TABS: 40.0000 mg | ORAL_TABLET | Freq: Every day | ORAL | 3 refills | Status: DC

## 2019-10-13 NOTE — Progress Notes (Signed)
Patient ID: Kathleen Moyer                 DOB: [birth year]                      MRN: [id number]     HPI: Kathleen Moyer is a 63 y.o. female patient of Dr. Marisue Humble, referred by Dr. Marlou Porch to HTN clinic. PMH is significant for HTN, HLD, and tobacco abuse. She presented to the ED on 06/10/19 complaining of abdominal pain (7/10 during day, 10/10 at night) for several days, which she attributed to starting new HTN medications. Her BP then was 142/76, and HR was 60. She was ultimately discharged with no changes, and followed up with her PCP (Dr. Marisue Humble) who referred her to Dr. Marlou Porch. She saw Dr. Marlou Porch on 08/30/2019. He noted her last BP was 168/72, with averages in July of 155/80, however, she presented with BP of 180/70 and HR of 53. An EKG was ordered which showed sinus bradycardia but no other abnormalities. During that visit she was started on spironolactone 25 mg daily. However, on 11/4 via phone f/u, her home BP was noted to be elevated (167/69, 174/75, 161/74) and Dr. Marlou Porch added telmisartan 40 mg daily as well. A renal artery duplex was done on 11/4 to rule out secondary HTN, and found no stenosis or abnormalities. At last visit in HTN clinic on 11/19, BP remained elevated at 142/70, BMET was stable, and telmisartan was increased to 80mg .  Pt presents today in good spirits. She provided home BP log (below), and values show improvements with about 50% of home readings at goal. She has started checking her BP readings a few hours after taking her medications. She reports tolerating her medications well, including her new telmisartan. Denies any dizziness, headaches, falls, or vision changes. Her NSAID use is very infrequent, but has it in case of pain from hip bursitis issue.  She states her BP usually runs higher in clinic. States she has brought her cuff to her PCP previously and the readings matched well.  Current HTN meds:  Atenolol-chlorthalidone (Tenoretic) 100-25 mg daily (AM)  Doxazosin 2 mg daily (PM) Hydralazine 25 mg BID (AM and PM) Spironolactone 25 mg daily (PM) Telmisartan 80 mg daily (PM)  Previously tried:  Valsartan 320mg  (abdominal pain) Amlodipine (feet swelling)  Lisinopril (ineffective in 2019)  BP goal: <130/80 mmHg  Family History: CVA in her father; Hypertension in her brother and father; Other in her mother. There is no history of Colon cancer or Liver disease.  Social History: Smoker (<1/2 pack/day)  Diet: Breakfast - eggs, bacon (sometimes), ham. Coffee (2-3 cups). Lunch - usually skips lunch) Supper - chicken nuggets, salads, not a lot of meat, doesn't cook a lot but doesn't eat out. No soda, mostly water.   Exercise: Not as active since retirement (2018), does walk through the house and outside.   Home BP readings: >50% of readings at home are at goal. High of 146/66, low of 106/62, felt ok during that time Wt Readings from Last 3 Encounters:  08/30/19 125 lb 12.8 oz (57.1 kg)   BP Readings from Last 3 Encounters:  09/22/19 (!) 142/70  08/30/19 (!) 180/70  06/10/19 (!) 142/76   Pulse Readings from Last 3 Encounters:  09/22/19 (!) 53  08/30/19 (!) 53  06/10/19 60    Renal function: CrCl cannot be calculated (Patient's most recent lab result is older than the maximum 21 days allowed.).  Past  Medical History:  Diagnosis Date  . Elevated fasting glucose   . Hypercholesteremia   . Hypertension     Current Outpatient Medications on File Prior to Visit  Medication Sig Dispense Refill  . atenolol-chlorthalidone (TENORETIC) 100-25 MG tablet Take 1 tablet by mouth daily.    Marland Kitchen doxazosin (CARDURA) 2 MG tablet Take 2 mg by mouth daily.    . hydrALAZINE (APRESOLINE) 25 MG tablet Take 25 mg by mouth 2 (two) times daily.    . naproxen sodium (ALEVE) 220 MG tablet Take 220 mg by mouth 2 (two) times daily as needed. Take 2 tablets by mouth BID with food for pain and inflammation as needed    . omeprazole (PRILOSEC) 20 MG capsule Take  1 capsule (20 mg total) by mouth daily. 14 capsule 0  . simvastatin (ZOCOR) 40 MG tablet Take 40 mg by mouth daily.    Marland Kitchen spironolactone (ALDACTONE) 25 MG tablet Take 1 tablet (25 mg total) by mouth daily. 90 tablet 3  . telmisartan (MICARDIS) 80 MG tablet Take 1 tablet (80 mg total) by mouth daily. 30 tablet 11   No current facility-administered medications on file prior to visit.    Allergies  Allergen Reactions  . Indocin [Indomethacin] Diarrhea and Nausea Only     Assessment/Plan:  1. Hypertension - Clinic reading above goal <130/39mmHg and about 50% of home readings above goal. Checking BMET today with recent dose increase of telmisartan. Will stop doxazosin to consolidate medications and increase hydralazine from 25mg  BID to 50mg  TID (advised pt she can take 50mg  BID for the next few days to ensure she tolerates higher dose ok). Continue atenolol-chlorthalidone 100-25 mg daily, telmisartan 80mg  daily, and spironolactone 25 mg daily. Advised pt to continue checking blood pressure at home daily. F/u in HTN clinic in 4 weeks.  Megan E. Supple, PharmD, BCACP, [place] [id number] [address]. [address], [place], [place] [address] Phone: [phone]; Fax: [phone] 10/13/2019 11:25 AM

## 2019-10-13 NOTE — Patient Instructions (Addendum)
It was nice to see you today  STOP taking doxazosin  INCREASE your hydralazine to 50mg  - you can take this 2 times a day for the next few days to make sure you tolerate it ok, then increase it to 3 times a day  Continue taking your other medications  Continue to monitor and record your blood pressure readings at home. Please bring these with you to your next visit in 4 weeks  If you have any questions, call Megan at [phone]

## 2019-11-10 ENCOUNTER — Other Ambulatory Visit: Payer: Self-pay

## 2019-11-10 ENCOUNTER — Ambulatory Visit (INDEPENDENT_AMBULATORY_CARE_PROVIDER_SITE_OTHER): Payer: Medicare Other | Admitting: Pharmacist

## 2019-11-10 VITALS — BP 130/72 | HR 61

## 2019-11-10 DIAGNOSIS — I10 Essential (primary) hypertension: Secondary | ICD-10-CM

## 2019-11-10 MED ORDER — SPIRONOLACTONE 25 MG PO TABS: 25.0000 mg | ORAL_TABLET | Freq: Every day | ORAL | 0 refills | Status: DC

## 2019-11-10 MED ORDER — HYDRALAZINE HCL 50 MG PO TABS: 50.0000 mg | ORAL_TABLET | Freq: Three times a day (TID) | ORAL | 3 refills | Status: DC

## 2019-11-10 MED ORDER — HYDRALAZINE HCL 50 MG PO TABS: 50.0000 mg | ORAL_TABLET | Freq: Three times a day (TID) | ORAL | 0 refills | Status: DC

## 2019-11-10 MED ORDER — ATENOLOL-CHLORTHALIDONE 100-25 MG PO TABS: 1.0000 | ORAL_TABLET | Freq: Every day | ORAL | 3 refills | Status: DC

## 2019-11-10 MED ORDER — SPIRONOLACTONE 25 MG PO TABS: 25.0000 mg | ORAL_TABLET | Freq: Every day | ORAL | 3 refills | Status: DC

## 2019-11-10 MED ORDER — TELMISARTAN 80 MG PO TABS: 80.0000 mg | ORAL_TABLET | Freq: Every day | ORAL | 3 refills | Status: DC

## 2019-11-10 NOTE — Progress Notes (Signed)
Patient ID: Kathleen Moyer                 DOB: October 20, 1956                      MRN: [id number]     HPI: Kathleen Moyer is a 64 y.o. female patient of Dr. Marisue Humble, referred by Dr. Marlou Porch to HTN clinic. PMH is significant for HTN, HLD, and tobacco abuse. She presented to the ED on 06/10/19 complaining of abdominal pain (7/10 during day, 10/10 at night) for several days, which she attributed to starting new HTN medications. Her BP then was 142/76, and HR was 60. She was ultimately discharged with no changes, and followed up with her PCP (Dr. Marisue Humble) who referred her to Dr. Marlou Porch. She saw Dr. Marlou Porch on 08/30/2019. He noted her last BP was 168/72, with averages in July of 155/80, however, she presented with BP of 180/70 and HR of 53. An EKG was ordered which showed sinus bradycardia but no other abnormalities. During that visit she was started on spironolactone 25 mg daily. However, on 11/4 via phone f/u, her home BP was noted to be elevated (167/69, 174/75, 161/74) and Dr. Marlou Porch added telmisartan 40 mg daily as well. A renal artery duplex was done on 11/4 to rule out secondary HTN, and found no stenosis or abnormalities. At last visit in HTN clinic on 12/10, BP remained elevated at 152/68, low dose doxazosin was stopped to consolidate meds and hydralazine was increased to 50mg  TID.  Pt presents today in good spirits. She reports tolerating her medication changes well. Denies dizziness, headaches, vision changes, falls, or LEE. She checks her BP at 8am a few hours after taking her AM medications. Home readings - 119/59 - 161/77, most ranging 110-120s/70s, HR mostly 60s. Still smoking 1/2 PPD. Her NSAID use is very infrequent, but has it in case of pain from hip bursitis issue.  She states her BP usually runs higher in clinic. States she has brought her cuff to her PCP previously and the readings matched well.  Current HTN meds:  Atenolol-chlorthalidone (Tenoretic) 100-25 mg daily (AM) Hydralazine  50mg  TID Spironolactone 25 mg daily (PM) Telmisartan 80 mg daily (PM)  Previously tried:  Valsartan 320mg  (abdominal pain) Amlodipine (feet swelling)  Lisinopril (ineffective in 2019) Doxazosin 2mg  (stopped to consolidate meds)  BP goal: <130/80 mmHg  Family History: CVA in her father; Hypertension in her brother and father; Other in her mother. There is no history of Colon cancer or Liver disease.  Social History: Smoker (<1/2 pack/day)  Diet: Breakfast - eggs, bacon (sometimes), ham. Coffee (2-3 cups). Lunch - usually skips lunch) Supper - chicken nuggets, salads, not a lot of meat, doesn't cook a lot but doesn't eat out. No soda, mostly water.   Exercise: Not as active since retirement (2018), does walk through the house and outside.   Home BP readings: >50% of readings at home are at goal. High of 146/66, low of 106/62, felt ok during that time Wt Readings from Last 3 Encounters:  08/30/19 125 lb 12.8 oz (57.1 kg)   BP Readings from Last 3 Encounters:  10/13/19 (!) 152/68  09/22/19 (!) 142/70  08/30/19 (!) 180/70   Pulse Readings from Last 3 Encounters:  10/13/19 (!) 58  09/22/19 (!) 53  08/30/19 (!) 53    Renal function: CrCl cannot be calculated (Patient's most recent lab result is older than the maximum 21 days allowed.).  Past Medical  History:  Diagnosis Date  . Elevated fasting glucose   . Hypercholesteremia   . Hypertension     Current Outpatient Medications on File Prior to Visit  Medication Sig Dispense Refill  . atenolol-chlorthalidone (TENORETIC) 100-25 MG tablet Take 1 tablet by mouth daily. 90 tablet 3  . hydrALAZINE (APRESOLINE) 50 MG tablet Take 1 tablet (50 mg total) by mouth 3 (three) times daily. 90 tablet 11  . naproxen sodium (ALEVE) 220 MG tablet Take 220 mg by mouth 2 (two) times daily as needed. Take 2 tablets by mouth BID with food for pain and inflammation as needed    . omeprazole (PRILOSEC) 20 MG capsule Take 1 capsule (20 mg total) by  mouth daily. 14 capsule 0  . simvastatin (ZOCOR) 40 MG tablet Take 1 tablet (40 mg total) by mouth daily. 90 tablet 3  . spironolactone (ALDACTONE) 25 MG tablet Take 1 tablet (25 mg total) by mouth daily. 90 tablet 3  . telmisartan (MICARDIS) 80 MG tablet Take 1 tablet (80 mg total) by mouth daily. 90 tablet 3   No current facility-administered medications on file prior to visit.    Allergies  Allergen Reactions  . Indocin [Indomethacin] Diarrhea and Nausea Only     Assessment/Plan:  1. Hypertension - BP has improved and is now at goal <130/56mmHg in the office and with most home readings. Will continue hydralazine 50mg  TID, atenolol-chlorthalidone 100-25 mg daily, telmisartan 80mg  daily, and spironolactone 25 mg daily. Advised pt to continue checking blood pressure at home daily and call clinic with any concerns. F/u in HTN clinic as needed.  Milanya Sunderland E. Tanayah Squitieri, PharmD, BCACP, [place] [id number] [address]. [address], [place], [place] [address] Phone: [phone]; Fax: [phone] 11/10/2019 7:49 AM

## 2019-11-10 NOTE — Patient Instructions (Addendum)
It was nice to see you today  Your blood pressure is at goal is less than 130/59mmHg  Continue taking your atenolol-chlorthalidone (Tenoretic) once daily, hydralazine 3x a day, spironolactone once daily, and telmisartan once daily for your blood pressure  Try to cut back on smoking  Continue to monitor your blood pressure at home. Call clinic with any concerns

## 2019-12-14 ENCOUNTER — Telehealth: Payer: Self-pay | Admitting: Pharmacist

## 2019-12-14 NOTE — Telephone Encounter (Signed)
Patient called clinic stating that her blood pressure has been running low and she feels bad. She also has had some dizzy spells. Reports SBP 110, 106, 111. I have advised patient to decrease hydralazine to 25mg  three times a day. Instructed her to call back if no improvement is seen.

## 2020-04-22 ENCOUNTER — Other Ambulatory Visit: Payer: Self-pay | Admitting: Cardiology

## 2020-07-10 ENCOUNTER — Other Ambulatory Visit: Payer: Self-pay | Admitting: Family Medicine

## 2020-07-10 DIAGNOSIS — Z1231 Encounter for screening mammogram for malignant neoplasm of breast: Secondary | ICD-10-CM

## 2020-07-25 ENCOUNTER — Ambulatory Visit
Admission: RE | Admit: 2020-07-25 | Discharge: 2020-07-25 | Disposition: A | Discharge status: Home / Self Care | Payer: Medicare Other | Source: Ambulatory Visit | Attending: Family Medicine | Admitting: Family Medicine

## 2020-07-25 ENCOUNTER — Other Ambulatory Visit: Payer: Self-pay

## 2020-07-25 DIAGNOSIS — Z1231 Encounter for screening mammogram for malignant neoplasm of breast: Secondary | ICD-10-CM

## 2020-08-10 ENCOUNTER — Other Ambulatory Visit: Payer: Self-pay | Admitting: Family Medicine

## 2020-08-10 DIAGNOSIS — R748 Abnormal levels of other serum enzymes: Secondary | ICD-10-CM

## 2020-08-21 ENCOUNTER — Ambulatory Visit
Admission: RE | Admit: 2020-08-21 | Discharge: 2020-08-21 | Disposition: A | Discharge status: Home / Self Care | Payer: Medicare Other | Source: Ambulatory Visit | Attending: Family Medicine | Admitting: Family Medicine

## 2020-08-21 DIAGNOSIS — R748 Abnormal levels of other serum enzymes: Secondary | ICD-10-CM

## 2020-09-13 ENCOUNTER — Other Ambulatory Visit: Payer: Self-pay | Admitting: Cardiology

## 2020-11-08 DIAGNOSIS — R748 Abnormal levels of other serum enzymes: Secondary | ICD-10-CM | POA: Diagnosis not present

## 2020-11-08 DIAGNOSIS — E7800 Pure hypercholesterolemia, unspecified: Secondary | ICD-10-CM | POA: Diagnosis not present

## 2020-12-12 IMAGING — MG DIGITAL SCREENING BILATERAL MAMMOGRAM WITH TOMO AND CAD
8 series · 8 of 24 positions shown · non-contrast
Comparison: Previous exam(s).

CLINICAL DATA: Screening.

EXAM:
DIGITAL SCREENING BILATERAL MAMMOGRAM WITH TOMO AND CAD

[L MLO synth-2D]
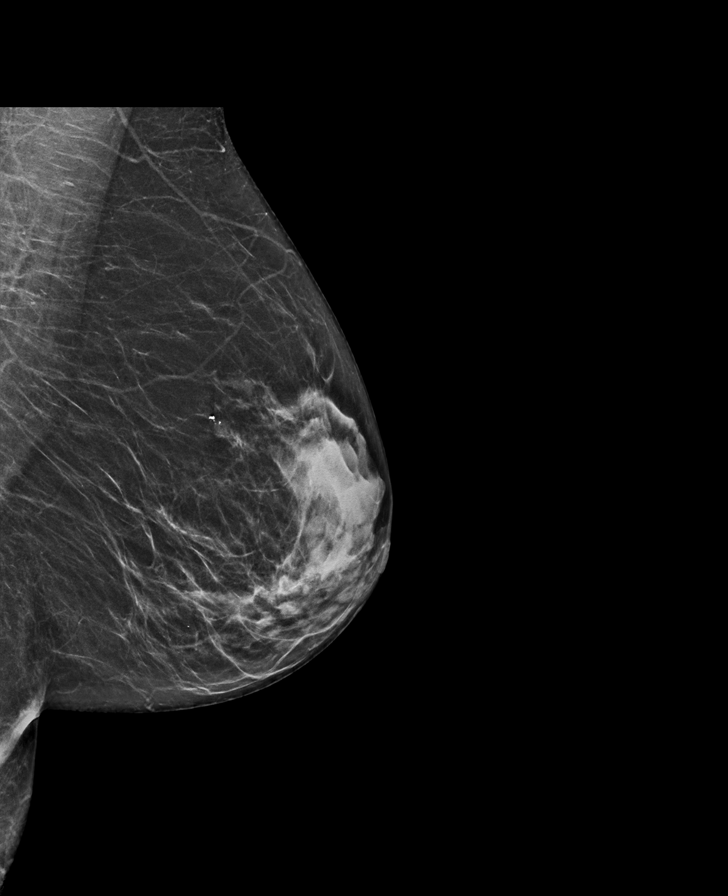

[L CC synth-2D]
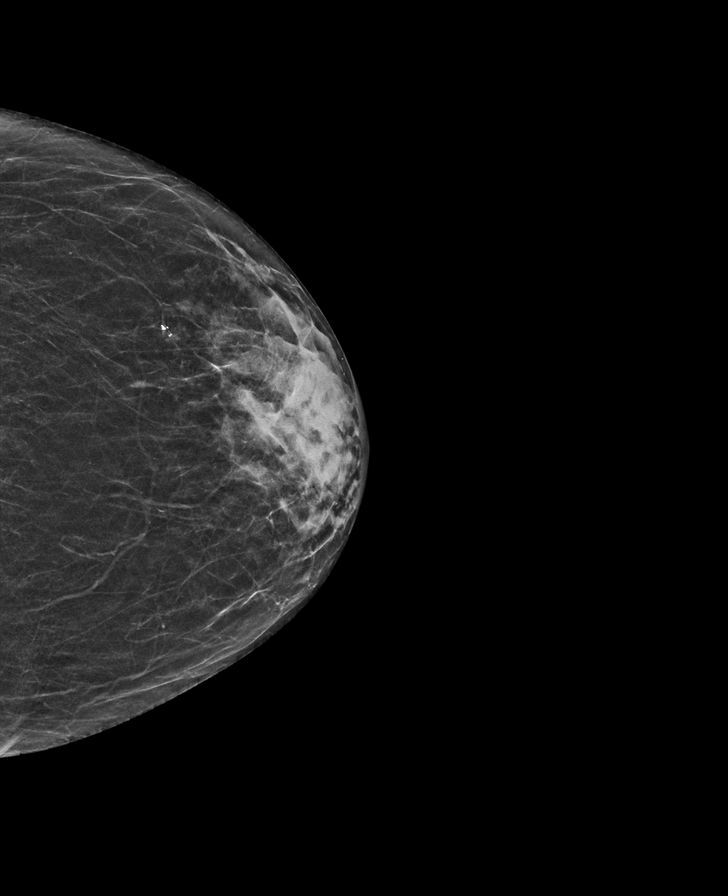

[R MLO synth-2D]
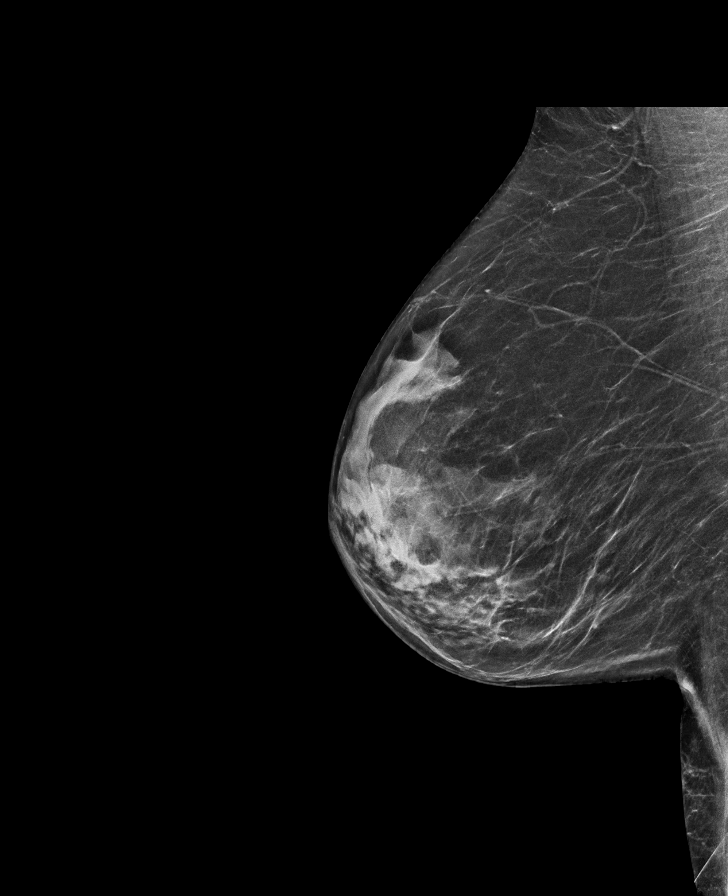

[R CC synth-2D]
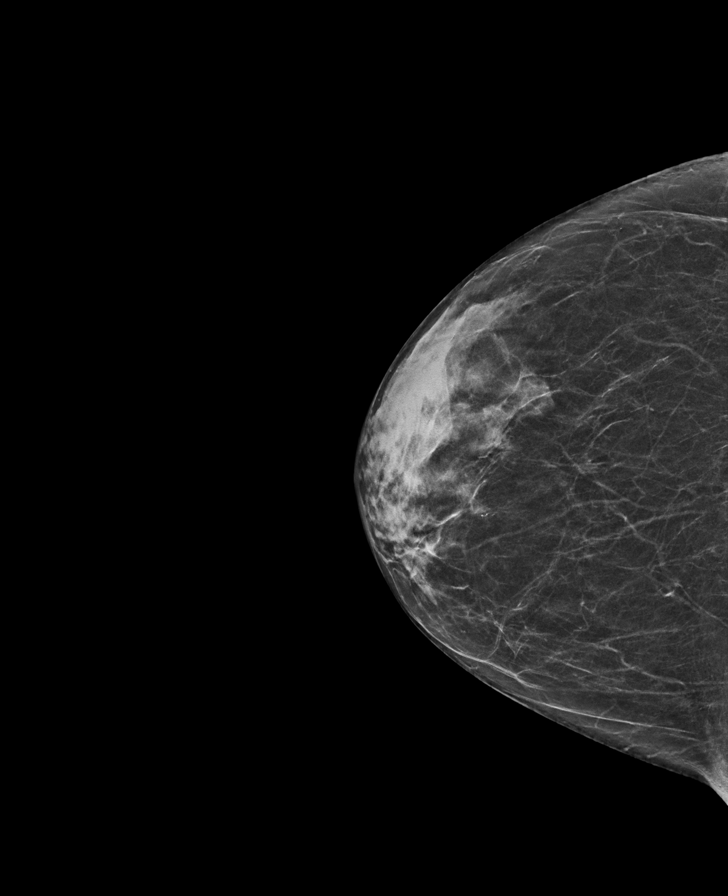

[L MLO tomo · tomo slice 30/59.0]
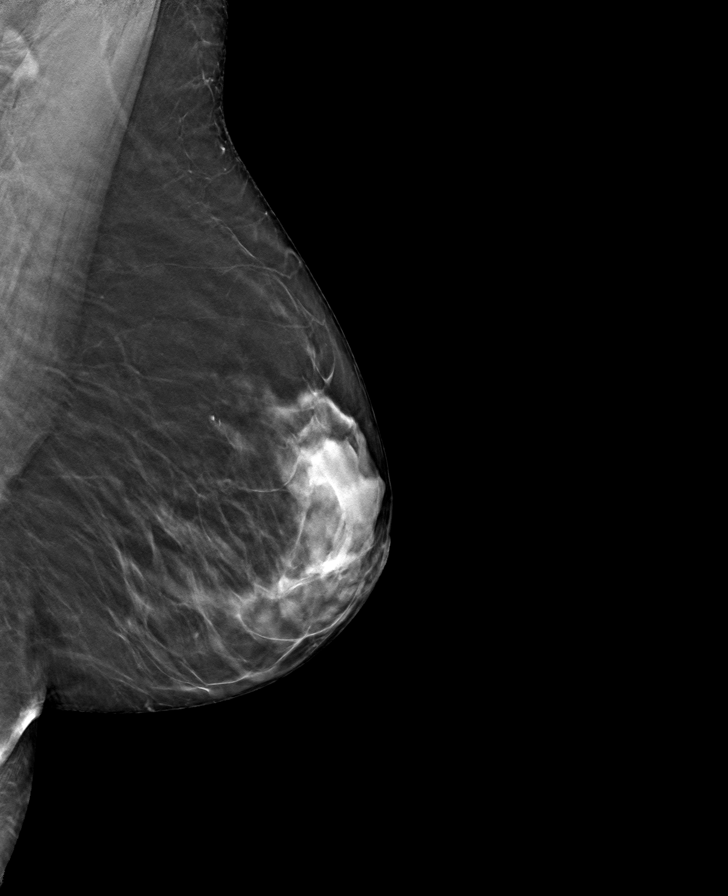

[R MLO tomo · tomo slice 29/58.0]
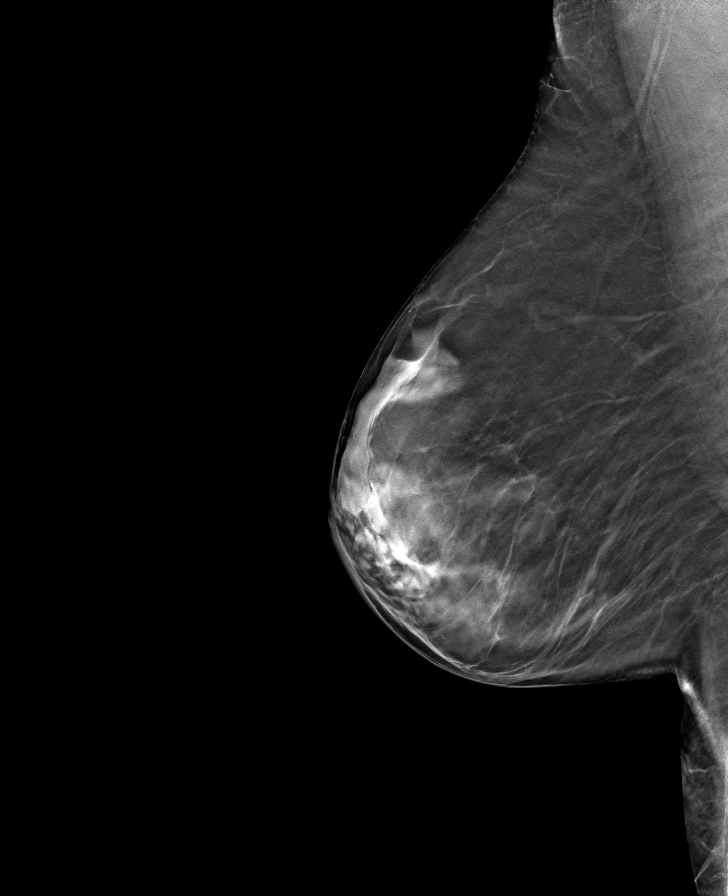

[L CC tomo · tomo slice 27/53.0]
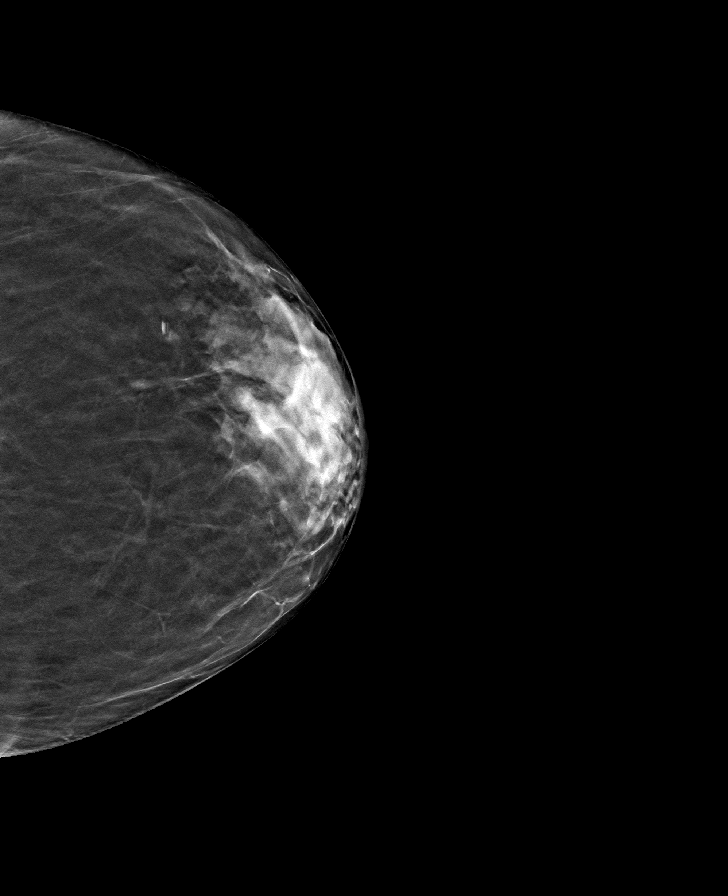

[R CC tomo · tomo slice 27/52.0]
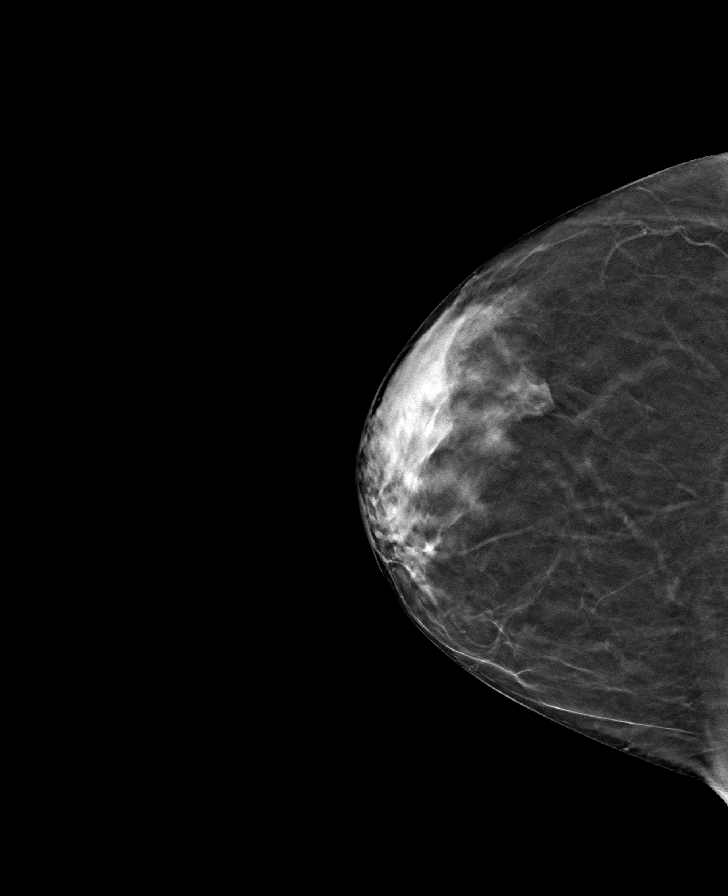

[8 of 24 positions shown; findings below may reference images not displayed]

ACR Breast Density Category c: The breast tissue is heterogeneously
dense, which may obscure small masses.
FINDINGS: There are no findings suspicious for malignancy. Images were
processed with CAD.
IMPRESSION: No mammographic evidence of malignancy. A result letter of this
screening mammogram will be mailed directly to the patient.

RECOMMENDATION:
Screening mammogram in one year. (Code:FT-U-LHB)

BI-RADS CATEGORY  1: Negative.

## 2021-08-12 DIAGNOSIS — Z1389 Encounter for screening for other disorder: Secondary | ICD-10-CM | POA: Diagnosis not present

## 2021-08-12 DIAGNOSIS — F17200 Nicotine dependence, unspecified, uncomplicated: Secondary | ICD-10-CM | POA: Diagnosis not present

## 2021-08-12 DIAGNOSIS — R7301 Impaired fasting glucose: Secondary | ICD-10-CM | POA: Diagnosis not present

## 2021-08-12 DIAGNOSIS — I10 Essential (primary) hypertension: Secondary | ICD-10-CM | POA: Diagnosis not present

## 2021-08-12 DIAGNOSIS — Z1211 Encounter for screening for malignant neoplasm of colon: Secondary | ICD-10-CM | POA: Diagnosis not present

## 2021-08-12 DIAGNOSIS — E7800 Pure hypercholesterolemia, unspecified: Secondary | ICD-10-CM | POA: Diagnosis not present

## 2021-08-12 DIAGNOSIS — Z0000 Encounter for general adult medical examination without abnormal findings: Secondary | ICD-10-CM | POA: Diagnosis not present

## 2021-08-16 ENCOUNTER — Other Ambulatory Visit: Payer: Self-pay | Admitting: Cardiology

## 2021-08-19 ENCOUNTER — Other Ambulatory Visit: Payer: Self-pay

## 2021-08-26 DIAGNOSIS — I10 Essential (primary) hypertension: Secondary | ICD-10-CM | POA: Diagnosis not present

## 2021-08-26 DIAGNOSIS — F17200 Nicotine dependence, unspecified, uncomplicated: Secondary | ICD-10-CM | POA: Diagnosis not present

## 2021-08-26 DIAGNOSIS — Z1211 Encounter for screening for malignant neoplasm of colon: Secondary | ICD-10-CM | POA: Diagnosis not present

## 2021-09-16 DIAGNOSIS — H0288A Meibomian gland dysfunction right eye, upper and lower eyelids: Secondary | ICD-10-CM | POA: Diagnosis not present

## 2021-09-16 DIAGNOSIS — H25812 Combined forms of age-related cataract, left eye: Secondary | ICD-10-CM | POA: Diagnosis not present

## 2021-09-16 DIAGNOSIS — H0288B Meibomian gland dysfunction left eye, upper and lower eyelids: Secondary | ICD-10-CM | POA: Diagnosis not present

## 2021-09-16 DIAGNOSIS — Z961 Presence of intraocular lens: Secondary | ICD-10-CM | POA: Diagnosis not present

## 2021-09-16 DIAGNOSIS — H16223 Keratoconjunctivitis sicca, not specified as Sjogren's, bilateral: Secondary | ICD-10-CM | POA: Diagnosis not present

## 2021-10-11 DIAGNOSIS — R195 Other fecal abnormalities: Secondary | ICD-10-CM | POA: Diagnosis not present

## 2021-10-21 DIAGNOSIS — K648 Other hemorrhoids: Secondary | ICD-10-CM | POA: Diagnosis not present

## 2021-10-21 DIAGNOSIS — R195 Other fecal abnormalities: Secondary | ICD-10-CM | POA: Diagnosis not present

## 2021-10-21 DIAGNOSIS — D122 Benign neoplasm of ascending colon: Secondary | ICD-10-CM | POA: Diagnosis not present

## 2021-10-21 DIAGNOSIS — K644 Residual hemorrhoidal skin tags: Secondary | ICD-10-CM | POA: Diagnosis not present

## 2021-10-21 DIAGNOSIS — K6389 Other specified diseases of intestine: Secondary | ICD-10-CM | POA: Diagnosis not present

## 2021-10-21 DIAGNOSIS — D120 Benign neoplasm of cecum: Secondary | ICD-10-CM | POA: Diagnosis not present

## 2021-10-23 DIAGNOSIS — D122 Benign neoplasm of ascending colon: Secondary | ICD-10-CM | POA: Diagnosis not present

## 2022-09-09 DIAGNOSIS — E7800 Pure hypercholesterolemia, unspecified: Secondary | ICD-10-CM | POA: Diagnosis not present

## 2022-09-09 DIAGNOSIS — Z0000 Encounter for general adult medical examination without abnormal findings: Secondary | ICD-10-CM | POA: Diagnosis not present

## 2022-09-09 DIAGNOSIS — Z86010 Personal history of colonic polyps: Secondary | ICD-10-CM | POA: Diagnosis not present

## 2022-09-09 DIAGNOSIS — Z1239 Encounter for other screening for malignant neoplasm of breast: Secondary | ICD-10-CM | POA: Diagnosis not present

## 2022-09-09 DIAGNOSIS — I10 Essential (primary) hypertension: Secondary | ICD-10-CM | POA: Diagnosis not present

## 2022-09-09 DIAGNOSIS — F17200 Nicotine dependence, unspecified, uncomplicated: Secondary | ICD-10-CM | POA: Diagnosis not present

## 2022-09-09 DIAGNOSIS — R7303 Prediabetes: Secondary | ICD-10-CM | POA: Diagnosis not present

## 2022-09-09 DIAGNOSIS — I700 Atherosclerosis of aorta: Secondary | ICD-10-CM | POA: Diagnosis not present

## 2022-09-17 DIAGNOSIS — H0288A Meibomian gland dysfunction right eye, upper and lower eyelids: Secondary | ICD-10-CM | POA: Diagnosis not present

## 2022-09-17 DIAGNOSIS — H25812 Combined forms of age-related cataract, left eye: Secondary | ICD-10-CM | POA: Diagnosis not present

## 2022-09-17 DIAGNOSIS — Z961 Presence of intraocular lens: Secondary | ICD-10-CM | POA: Diagnosis not present

## 2022-09-17 DIAGNOSIS — H0288B Meibomian gland dysfunction left eye, upper and lower eyelids: Secondary | ICD-10-CM | POA: Diagnosis not present

## 2022-09-30 DIAGNOSIS — E871 Hypo-osmolality and hyponatremia: Secondary | ICD-10-CM | POA: Diagnosis not present

## 2023-01-06 DIAGNOSIS — D125 Benign neoplasm of sigmoid colon: Secondary | ICD-10-CM | POA: Diagnosis not present

## 2023-01-06 DIAGNOSIS — K648 Other hemorrhoids: Secondary | ICD-10-CM | POA: Diagnosis not present

## 2023-01-06 DIAGNOSIS — Z86010 Personal history of colonic polyps: Secondary | ICD-10-CM | POA: Diagnosis not present

## 2023-01-06 DIAGNOSIS — Z09 Encounter for follow-up examination after completed treatment for conditions other than malignant neoplasm: Secondary | ICD-10-CM | POA: Diagnosis not present

## 2023-01-06 DIAGNOSIS — K644 Residual hemorrhoidal skin tags: Secondary | ICD-10-CM | POA: Diagnosis not present

## 2023-01-08 DIAGNOSIS — D125 Benign neoplasm of sigmoid colon: Secondary | ICD-10-CM | POA: Diagnosis not present

## 2023-09-14 ENCOUNTER — Other Ambulatory Visit: Payer: Self-pay | Admitting: Family Medicine

## 2023-09-14 DIAGNOSIS — Z13820 Encounter for screening for osteoporosis: Secondary | ICD-10-CM | POA: Diagnosis not present

## 2023-09-14 DIAGNOSIS — E2839 Other primary ovarian failure: Secondary | ICD-10-CM

## 2023-09-14 DIAGNOSIS — Z0000 Encounter for general adult medical examination without abnormal findings: Secondary | ICD-10-CM | POA: Diagnosis not present

## 2023-09-14 DIAGNOSIS — E7800 Pure hypercholesterolemia, unspecified: Secondary | ICD-10-CM | POA: Diagnosis not present

## 2023-09-14 DIAGNOSIS — R7303 Prediabetes: Secondary | ICD-10-CM | POA: Diagnosis not present

## 2023-09-14 DIAGNOSIS — Z1231 Encounter for screening mammogram for malignant neoplasm of breast: Secondary | ICD-10-CM

## 2023-09-14 DIAGNOSIS — Z1239 Encounter for other screening for malignant neoplasm of breast: Secondary | ICD-10-CM | POA: Diagnosis not present

## 2023-09-14 DIAGNOSIS — F17210 Nicotine dependence, cigarettes, uncomplicated: Secondary | ICD-10-CM | POA: Diagnosis not present

## 2023-09-14 DIAGNOSIS — Z122 Encounter for screening for malignant neoplasm of respiratory organs: Secondary | ICD-10-CM | POA: Diagnosis not present

## 2023-09-14 DIAGNOSIS — I10 Essential (primary) hypertension: Secondary | ICD-10-CM | POA: Diagnosis not present

## 2023-09-14 DIAGNOSIS — I700 Atherosclerosis of aorta: Secondary | ICD-10-CM | POA: Diagnosis not present

## 2023-09-23 DIAGNOSIS — Z961 Presence of intraocular lens: Secondary | ICD-10-CM | POA: Diagnosis not present

## 2023-09-23 DIAGNOSIS — H0288B Meibomian gland dysfunction left eye, upper and lower eyelids: Secondary | ICD-10-CM | POA: Diagnosis not present

## 2023-09-23 DIAGNOSIS — H25812 Combined forms of age-related cataract, left eye: Secondary | ICD-10-CM | POA: Diagnosis not present

## 2023-09-23 DIAGNOSIS — H0288A Meibomian gland dysfunction right eye, upper and lower eyelids: Secondary | ICD-10-CM | POA: Diagnosis not present

## 2023-10-14 ENCOUNTER — Ambulatory Visit
Admission: RE | Admit: 2023-10-14 | Discharge: 2023-10-14 | Disposition: A | Discharge status: Home / Self Care | Payer: 59 | Source: Ambulatory Visit | Attending: Family Medicine | Admitting: Family Medicine

## 2023-10-14 DIAGNOSIS — Z1231 Encounter for screening mammogram for malignant neoplasm of breast: Secondary | ICD-10-CM

## 2023-10-16 ENCOUNTER — Other Ambulatory Visit: Payer: Self-pay | Admitting: Family Medicine

## 2023-10-16 DIAGNOSIS — R928 Other abnormal and inconclusive findings on diagnostic imaging of breast: Secondary | ICD-10-CM

## 2023-10-18 DIAGNOSIS — H2512 Age-related nuclear cataract, left eye: Secondary | ICD-10-CM | POA: Diagnosis not present

## 2023-10-22 DIAGNOSIS — H2512 Age-related nuclear cataract, left eye: Secondary | ICD-10-CM | POA: Diagnosis not present

## 2023-10-22 DIAGNOSIS — H2589 Other age-related cataract: Secondary | ICD-10-CM | POA: Diagnosis not present

## 2023-11-06 ENCOUNTER — Ambulatory Visit
Admission: RE | Admit: 2023-11-06 | Discharge: 2023-11-06 | Disposition: A | Discharge status: Home / Self Care | Payer: 59 | Source: Ambulatory Visit | Attending: Family Medicine | Admitting: Family Medicine

## 2023-11-06 ENCOUNTER — Ambulatory Visit: Payer: 59

## 2023-11-06 DIAGNOSIS — R928 Other abnormal and inconclusive findings on diagnostic imaging of breast: Secondary | ICD-10-CM

## 2024-03-08 DIAGNOSIS — R7301 Impaired fasting glucose: Secondary | ICD-10-CM | POA: Diagnosis not present

## 2024-03-08 DIAGNOSIS — I10 Essential (primary) hypertension: Secondary | ICD-10-CM | POA: Diagnosis not present

## 2024-03-11 DIAGNOSIS — I10 Essential (primary) hypertension: Secondary | ICD-10-CM | POA: Diagnosis not present

## 2024-03-11 DIAGNOSIS — E7800 Pure hypercholesterolemia, unspecified: Secondary | ICD-10-CM | POA: Diagnosis not present

## 2024-03-11 DIAGNOSIS — F17210 Nicotine dependence, cigarettes, uncomplicated: Secondary | ICD-10-CM | POA: Diagnosis not present

## 2024-03-11 DIAGNOSIS — R7301 Impaired fasting glucose: Secondary | ICD-10-CM | POA: Diagnosis not present

## 2024-03-30 ENCOUNTER — Other Ambulatory Visit (HOSPITAL_COMMUNITY): Payer: Self-pay | Admitting: Internal Medicine

## 2024-03-30 ENCOUNTER — Encounter (HOSPITAL_BASED_OUTPATIENT_CLINIC_OR_DEPARTMENT_OTHER): Payer: Self-pay

## 2024-03-30 ENCOUNTER — Ambulatory Visit (HOSPITAL_BASED_OUTPATIENT_CLINIC_OR_DEPARTMENT_OTHER): Admission: RE | Admit: 2024-03-30 | Source: Ambulatory Visit

## 2024-03-30 DIAGNOSIS — F17200 Nicotine dependence, unspecified, uncomplicated: Secondary | ICD-10-CM | POA: Diagnosis not present

## 2024-03-30 DIAGNOSIS — R1011 Right upper quadrant pain: Secondary | ICD-10-CM | POA: Diagnosis not present

## 2024-03-30 DIAGNOSIS — R031 Nonspecific low blood-pressure reading: Secondary | ICD-10-CM | POA: Diagnosis not present

## 2024-03-30 DIAGNOSIS — R1110 Vomiting, unspecified: Secondary | ICD-10-CM | POA: Diagnosis not present

## 2024-03-31 ENCOUNTER — Encounter (HOSPITAL_BASED_OUTPATIENT_CLINIC_OR_DEPARTMENT_OTHER): Payer: Self-pay

## 2024-03-31 ENCOUNTER — Ambulatory Visit (HOSPITAL_BASED_OUTPATIENT_CLINIC_OR_DEPARTMENT_OTHER)
Admission: RE | Admit: 2024-03-31 | Discharge: 2024-03-31 | Disposition: A | Discharge status: Home / Self Care | Source: Ambulatory Visit | Attending: Internal Medicine | Admitting: Internal Medicine

## 2024-03-31 DIAGNOSIS — R1011 Right upper quadrant pain: Secondary | ICD-10-CM

## 2024-04-01 ENCOUNTER — Ambulatory Visit (HOSPITAL_BASED_OUTPATIENT_CLINIC_OR_DEPARTMENT_OTHER)
Admission: RE | Admit: 2024-04-01 | Discharge: 2024-04-01 | Disposition: A | Discharge status: Home / Self Care | Source: Ambulatory Visit | Attending: Internal Medicine | Admitting: Internal Medicine

## 2024-04-01 DIAGNOSIS — R1011 Right upper quadrant pain: Secondary | ICD-10-CM | POA: Insufficient documentation

## 2024-04-14 DIAGNOSIS — D72829 Elevated white blood cell count, unspecified: Secondary | ICD-10-CM | POA: Diagnosis not present

## 2024-04-14 DIAGNOSIS — I10 Essential (primary) hypertension: Secondary | ICD-10-CM | POA: Diagnosis not present

## 2024-05-09 DIAGNOSIS — H16223 Keratoconjunctivitis sicca, not specified as Sjogren's, bilateral: Secondary | ICD-10-CM | POA: Diagnosis not present

## 2024-05-31 DIAGNOSIS — I10 Essential (primary) hypertension: Secondary | ICD-10-CM | POA: Diagnosis not present

## 2024-06-22 DIAGNOSIS — I10 Essential (primary) hypertension: Secondary | ICD-10-CM | POA: Diagnosis not present

## 2024-06-22 DIAGNOSIS — R7301 Impaired fasting glucose: Secondary | ICD-10-CM | POA: Diagnosis not present

## 2024-06-22 DIAGNOSIS — D72829 Elevated white blood cell count, unspecified: Secondary | ICD-10-CM | POA: Diagnosis not present

## 2024-06-22 DIAGNOSIS — E7800 Pure hypercholesterolemia, unspecified: Secondary | ICD-10-CM | POA: Diagnosis not present

## 2024-07-28 ENCOUNTER — Other Ambulatory Visit: Payer: 59

## 2024-07-28 ENCOUNTER — Other Ambulatory Visit (HOSPITAL_BASED_OUTPATIENT_CLINIC_OR_DEPARTMENT_OTHER)

## 2024-08-16 DIAGNOSIS — I739 Peripheral vascular disease, unspecified: Secondary | ICD-10-CM | POA: Diagnosis not present

## 2024-08-19 ENCOUNTER — Other Ambulatory Visit: Payer: Self-pay

## 2024-08-19 DIAGNOSIS — M79673 Pain in unspecified foot: Secondary | ICD-10-CM

## 2024-08-29 DIAGNOSIS — E7800 Pure hypercholesterolemia, unspecified: Secondary | ICD-10-CM | POA: Diagnosis not present

## 2024-09-15 ENCOUNTER — Encounter: Payer: Self-pay | Admitting: Vascular Surgery

## 2024-09-15 ENCOUNTER — Ambulatory Visit (INDEPENDENT_AMBULATORY_CARE_PROVIDER_SITE_OTHER): Admitting: Vascular Surgery

## 2024-09-15 ENCOUNTER — Other Ambulatory Visit (HOSPITAL_COMMUNITY): Payer: Self-pay

## 2024-09-15 ENCOUNTER — Ambulatory Visit (HOSPITAL_COMMUNITY)
Admission: RE | Admit: 2024-09-15 | Discharge: 2024-09-15 | Disposition: A | Discharge status: Home / Self Care | Source: Ambulatory Visit | Attending: Vascular Surgery | Admitting: Vascular Surgery

## 2024-09-15 VITALS — BP 150/69 | HR 86 | Temp 98.3°F | Resp 18 | Ht 63.0 in | Wt 124.0 lb

## 2024-09-15 DIAGNOSIS — I75021 Atheroembolism of right lower extremity: Secondary | ICD-10-CM | POA: Insufficient documentation

## 2024-09-15 DIAGNOSIS — M79673 Pain in unspecified foot: Secondary | ICD-10-CM | POA: Diagnosis present

## 2024-09-15 DIAGNOSIS — I70223 Atherosclerosis of native arteries of extremities with rest pain, bilateral legs: Secondary | ICD-10-CM

## 2024-09-15 LAB — VAS US ABI WITH/WO TBI
Left ABI: 0.39
Right ABI: 0.33

## 2024-09-15 MED ORDER — ASPIRIN 81 MG PO TBEC
81.0000 mg | DELAYED_RELEASE_TABLET | Freq: Every day | ORAL | 6 refills | Status: DC
  Filled 2024-09-15: qty 60, 60d supply, fill #0

## 2024-09-15 MED ORDER — HYDROCODONE-ACETAMINOPHEN 5-325 MG PO TABS
1.0000 | ORAL_TABLET | Freq: Four times a day (QID) | ORAL | 0 refills | Status: DC | PRN
  Filled 2024-09-15: qty 28, 7d supply, fill #0
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
## 2024-09-16 ENCOUNTER — Other Ambulatory Visit: Payer: Self-pay | Admitting: *Deleted

## 2024-09-16 DIAGNOSIS — I75021 Atheroembolism of right lower extremity: Secondary | ICD-10-CM

## 2024-09-21 ENCOUNTER — Ambulatory Visit (HOSPITAL_COMMUNITY)
Admission: RE | Admit: 2024-09-21 | Discharge: 2024-09-21 | Disposition: A | Discharge status: Home / Self Care | Source: Ambulatory Visit | Attending: Vascular Surgery | Admitting: Vascular Surgery

## 2024-09-21 DIAGNOSIS — I75021 Atheroembolism of right lower extremity: Secondary | ICD-10-CM | POA: Diagnosis present

## 2024-09-21 MED ORDER — IOHEXOL 350 MG/ML SOLN
100.0000 mL | Freq: Once | INTRAVENOUS | Status: AC | PRN
  Administered 2024-09-21: 100 mL via INTRAVENOUS

## 2024-09-22 ENCOUNTER — Telehealth: Payer: Self-pay

## 2024-09-22 NOTE — Telephone Encounter (Signed)
 Pt came in with family at 1630 upon completion of her CT with expectation of Dr. Lanis reading her results, and wanting to know what to do because her foot is causing her a lot of pain.CC,PAA explained to pt that Dr. Lanis isn't in the office today and being that it's the end of the day most of the staff have left, but would go speak to the nurse that was here. CC,PAA relayed to pt that per AL,RN that if she is having intense pain then she would refer her to the ED. Per CC, pt continued to ask questions about getting pain meds to relieve the pain or give her something to help her sleep, because pt kept asking questions CC asked this PAA to help explain to the patient what AL,RN referred pt to do. MP went to pt, who wanted to know why the results were not going to be read at that moment. MP explained that Dr. Lanis wasn't in the office and at this point (MP started speaking with pt at 16:50) just about everyone from the office has left for the day, therefore, an appt couldn't even be made. Pt states that she is in so much pain that it keeps her up at night, and proceeded to take off sock to show this PAA her bluish/purplish toes. MP agreed with pt that her situation is very painful and since nothing could be done about it at this time, she should at least go to the ED so her pain can be managed. Pt. States she doesn't want to go to the ED because of the wait. MP managed expectation once again stating that this conversation would be brought to the triage nurse on 09/22/24, so again, nothing would happen as quickly as she is expecting. MP also explained that once I explain to triage what pt is requesting, triage would have to submit message to [facility], and triage would have to wait on advice from the dr. It was also explained that pt may be told that she may have to come back to the office, or be referred back to the ED, depending on the advice of the physician. Pt is understanding after education was given and  expectations were managed. MP did go to TD,RN (triage) on the morning of the 20th and explained conversation with pt. RN to relay information to MD or PA.

## 2024-09-26 ENCOUNTER — Telehealth (HOSPITAL_BASED_OUTPATIENT_CLINIC_OR_DEPARTMENT_OTHER): Payer: Self-pay

## 2024-09-26 NOTE — Telephone Encounter (Signed)
 Patient scheduled for pre-op clearance on 10/18/2024 with Dr. Shelda Bruckner. Patient stated she is having a lot of pain in her feet to the point they are turning black. She also informed me that she cannot sleep due to it keeping her up at night. I asked her if she had been to the emergency room, she stated, Ma'am with all due respect, I do not want to wait all night in the emergency room, because there is no point. She is thankful we scheduled her when we did.

## 2024-09-26 NOTE — Telephone Encounter (Signed)
   Pre-operative Risk Assessment    Patient Name: Kathleen Moyer  DOB: October 22, 1956 MRN: [id number]   Date of last office visit: 08/30/2019, Dr. Jeffrie Date of next office visit: NONE, Pt. Will need to re-establish care with Cardiology with either [facility] or MD   Request for Surgical Clearance    Procedure:  Aortobifemoral Bypass  Date of Surgery:  Clearance TBD                                Surgeon: Dr. Silver Socks Group or Practice Name: [facility]  Phone number: [phone] Fax number: [phone]   Type of Clearance Requested:   - Medical  - Pharmacy:  Hold Aspirin      Type of Anesthesia:  General    Additional requests/questions:    SignedAsberry KANDICE Dunning   09/26/2024, 2:33 PM

## 2024-09-26 NOTE — Telephone Encounter (Signed)
   Name: Kathleen Moyer  DOB: 02-Oct-1956  MRN: [id number]  Primary Cardiologist: Oneil Parchment, MD  Chart reviewed as part of pre-operative protocol coverage. Because of [name] past medical history and time since last visit, she will require a follow-up in-office visit in order to better assess preoperative cardiovascular risk.  [name]: - [name]. If patient already had an upcoming appointment within acceptable timeframe, please add pre-op clearance to the appointment notes so provider is aware. - Please contact requesting surgeon's office via preferred method (i.e, phone, fax) to inform them of need for appointment prior to surgery.  Per office protocol, if patient is without any new symptoms or concerns at the time of their virtual visit, he/she may hold aspirin  for 5-7 days prior to procedure. Please resume aspirin  as soon as possible postprocedure, at the discretion of the surgeon.    Lum LITTIE Louis, NP  09/26/2024, 3:06 PM

## 2024-10-06 ENCOUNTER — Encounter: Payer: Self-pay | Admitting: Vascular Surgery

## 2024-10-06 ENCOUNTER — Other Ambulatory Visit (HOSPITAL_COMMUNITY): Payer: Self-pay

## 2024-10-06 ENCOUNTER — Ambulatory Visit: Attending: Vascular Surgery | Admitting: Vascular Surgery

## 2024-10-06 VITALS — BP 142/74 | HR 86 | Temp 98.2°F | Resp 18 | Ht 63.0 in | Wt 124.0 lb

## 2024-10-06 DIAGNOSIS — I75021 Atheroembolism of right lower extremity: Secondary | ICD-10-CM | POA: Diagnosis not present

## 2024-10-06 DIAGNOSIS — I70223 Atherosclerosis of native arteries of extremities with rest pain, bilateral legs: Secondary | ICD-10-CM | POA: Diagnosis not present

## 2024-10-06 MED ORDER — OXYCODONE-ACETAMINOPHEN 5-325 MG PO TABS
1.0000 | ORAL_TABLET | ORAL | 0 refills | Status: DC | PRN
  Filled 2024-10-06: qty 50, 5d supply, fill #0
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
## 2024-10-07 ENCOUNTER — Telehealth: Payer: Self-pay

## 2024-10-07 NOTE — Telephone Encounter (Signed)
 Per Dr. Lanis, Ms. Jantz needs to have cardiac clearance ASAP.  Once all cardiac evaluations have been completed, Dr. Lanis would like to set up a telephone call with her to determine plan of care.

## 2024-10-10 ENCOUNTER — Telehealth: Payer: Self-pay | Admitting: Pharmacy Technician

## 2024-10-10 ENCOUNTER — Other Ambulatory Visit (HOSPITAL_COMMUNITY): Payer: Self-pay

## 2024-10-10 ENCOUNTER — Ambulatory Visit

## 2024-10-10 VITALS — BP 130/74 | HR 97 | Ht 62.0 in | Wt 123.0 lb

## 2024-10-10 DIAGNOSIS — I739 Peripheral vascular disease, unspecified: Secondary | ICD-10-CM

## 2024-10-10 DIAGNOSIS — I10 Essential (primary) hypertension: Secondary | ICD-10-CM

## 2024-10-10 DIAGNOSIS — Z01818 Encounter for other preprocedural examination: Secondary | ICD-10-CM | POA: Diagnosis not present

## 2024-10-10 DIAGNOSIS — E7800 Pure hypercholesterolemia, unspecified: Secondary | ICD-10-CM | POA: Diagnosis not present

## 2024-10-10 LAB — LIPID PANEL
Chol/HDL Ratio: 3.1 ratio (ref 0.0–4.4)
Cholesterol, Total: 177 mg/dL (ref 100–199)
HDL: 57 mg/dL (ref >39)
LDL Chol Calc (NIH): 80 mg/dL (ref 0–99)
Triglycerides: 247 mg/dL — ABNORMAL HIGH (ref 0–149)
VLDL Cholesterol Cal: 40 mg/dL (ref 5–40)

## 2024-10-10 LAB — HEMOGLOBIN A1C
Est. average glucose Bld gHb Est-mCnc: 120 mg/dL
Hgb A1c MFr Bld: 5.8 % — ABNORMAL HIGH (ref 4.8–5.6)

## 2024-10-10 MED ORDER — RIVAROXABAN 2.5 MG PO TABS
2.5000 mg | ORAL_TABLET | Freq: Two times a day (BID) | ORAL | 3 refills | Status: AC
  Filled 2024-10-10: qty 60, 30d supply, fill #0

## 2024-10-10 NOTE — Telephone Encounter (Signed)
   Pharmacy Patient Advocate Encounter   Received notification from [facility] that prior authorization for xarelto  2.5mg   is required/requested.   Insurance verification completed.   The patient is insured through [employer].   Per pa: [id number]
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
## 2024-10-10 NOTE — Patient Instructions (Signed)
 Medication Instructions:  START Xarelto  2.5mg  Take 1 tablet twice a day  *If you need a refill on your cardiac medications before your next appointment, please call your pharmacy*  Lab Work: TODAY-LIPIDS & A1C If you have labs (blood work) drawn today and your tests are completely normal, you will receive your results only by: MyChart Message (if you have MyChart) OR A paper copy in the mail If you have any lab test that is abnormal or we need to change your treatment, we will call you to review the results.  Testing/Procedures: Your physician has requested that you have a STAT lexiscan myoview. For further information please visit [email or web address] Please follow instruction sheet, as given.  Your physician has requested that you have an STAT echocardiogram. Echocardiography is a painless test that uses sound waves to create images of your heart. It provides your doctor with information about the size and shape of your heart and how well your heart's chambers and valves are working. This procedure takes approximately one hour. There are no restrictions for this procedure. Please do NOT wear cologne, perfume, aftershave, or lotions (deodorant is allowed). Please arrive 15 minutes prior to your appointment time.  Please note: We ask at that you not bring children with you during ultrasound (echo/ vascular) testing. Due to room size and safety concerns, children are not allowed in the ultrasound rooms during exams. Our front office staff cannot provide observation of children in our lobby area while testing is being conducted. An adult accompanying a patient to their appointment will only be allowed in the ultrasound room at the discretion of the ultrasound technician under special circumstances. We apologize for any inconvenience.   Follow-Up: At [facility], you and your health needs are our priority.  As part of our continuing mission to provide you with exceptional heart care, our  providers are all part of one team.  This team includes your primary Cardiologist (physician) and Advanced Practice Providers or APPs (Physician Assistants and Nurse Practitioners) who all work together to provide you with the care you need, when you need it.  Your next appointment:   3 month(s)  Provider:   Joelle DEL. Azobou Tonelu, MD  We recommend signing up for the patient portal called MyChart.  Sign up information is provided on this After Visit Summary.  MyChart is used to connect with patients for Virtual Visits (Telemedicine).  Patients are able to view lab/test results, encounter notes, upcoming appointments, etc.  Non-urgent messages can be sent to your provider as well.   To learn more about what you can do with MyChart, go to [email or web address].   Other Instructions

## 2024-10-11 ENCOUNTER — Other Ambulatory Visit: Payer: Self-pay

## 2024-10-11 DIAGNOSIS — I739 Peripheral vascular disease, unspecified: Secondary | ICD-10-CM

## 2024-10-11 DIAGNOSIS — Z01818 Encounter for other preprocedural examination: Secondary | ICD-10-CM

## 2024-10-11 DIAGNOSIS — E7800 Pure hypercholesterolemia, unspecified: Secondary | ICD-10-CM

## 2024-10-11 DIAGNOSIS — I10 Essential (primary) hypertension: Secondary | ICD-10-CM

## 2024-10-14 ENCOUNTER — Ambulatory Visit (HOSPITAL_COMMUNITY): Admission: RE | Admit: 2024-10-14 | Discharge: 2024-10-14

## 2024-10-14 DIAGNOSIS — Z01810 Encounter for preprocedural cardiovascular examination: Secondary | ICD-10-CM

## 2024-10-14 DIAGNOSIS — I739 Peripheral vascular disease, unspecified: Secondary | ICD-10-CM

## 2024-10-14 DIAGNOSIS — Z01818 Encounter for other preprocedural examination: Secondary | ICD-10-CM | POA: Diagnosis present

## 2024-10-14 DIAGNOSIS — E7800 Pure hypercholesterolemia, unspecified: Secondary | ICD-10-CM

## 2024-10-14 DIAGNOSIS — I10 Essential (primary) hypertension: Secondary | ICD-10-CM

## 2024-10-14 LAB — MYOCARDIAL PERFUSION IMAGING
Base ST Depression (mm): 0 mm
LV dias vol: 51 mL (ref 46–106)
LV sys vol: 17 mL (ref 3.8–5.2)
Nuc Stress EF: 67 %
Peak HR: 107 {beats}/min
Rest HR: 89 {beats}/min
Rest Nuclear Isotope Dose: 9.6 mCi
SDS: 1
SRS: 0
SSS: 1
ST Depression (mm): 0 mm
Stress Nuclear Isotope Dose: 31.5 mCi
TID: 1.1

## 2024-10-14 LAB — ECHOCARDIOGRAM COMPLETE
Area-P 1/2: 4.21 cm2
S' Lateral: 2 cm

## 2024-10-14 MED ORDER — REGADENOSON 0.4 MG/5ML IV SOLN
0.4000 mg | Freq: Once | INTRAVENOUS | Status: AC
  Administered 2024-10-14: 0.4 mg via INTRAVENOUS

## 2024-10-14 MED ORDER — REGADENOSON 0.4 MG/5ML IV SOLN
INTRAVENOUS | Status: AC
  Filled 2024-10-14: qty 5

## 2024-10-14 MED ORDER — TECHNETIUM TC 99M TETROFOSMIN IV KIT
9.6000 | PACK | Freq: Once | INTRAVENOUS | Status: AC | PRN
  Administered 2024-10-14: 9.6 via INTRAVENOUS

## 2024-10-14 MED ORDER — TECHNETIUM TC 99M TETROFOSMIN IV KIT
31.5000 | PACK | Freq: Once | INTRAVENOUS | Status: AC | PRN
  Administered 2024-10-14: 31.5 via INTRAVENOUS

## 2024-10-18 ENCOUNTER — Ambulatory Visit (HOSPITAL_BASED_OUTPATIENT_CLINIC_OR_DEPARTMENT_OTHER): Admitting: Cardiology

## 2024-10-18 ENCOUNTER — Telehealth: Payer: Self-pay | Admitting: Vascular Surgery

## 2024-10-20 ENCOUNTER — Ambulatory Visit: Attending: Vascular Surgery | Admitting: Vascular Surgery

## 2024-10-20 DIAGNOSIS — I70235 Atherosclerosis of native arteries of right leg with ulceration of other part of foot: Secondary | ICD-10-CM | POA: Diagnosis not present

## 2024-10-20 DIAGNOSIS — I75021 Atheroembolism of right lower extremity: Secondary | ICD-10-CM

## 2024-10-20 MED ORDER — OXYCODONE-ACETAMINOPHEN 5-325 MG PO TABS: 1.0000 | ORAL_TABLET | ORAL | 0 refills | Status: DC | PRN
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
## 2024-10-20 NOTE — Progress Notes (Unsigned)
°   °  °  Cardiology Office Note Date:  10/20/2024  ID:  [name], DOB 07/05/1956, MRN [id number] PCP:  Leonel Cole, MD  Cardiologist:   Joelle VEAR Ren Donley, MD  No chief complaint on file.    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 female who presents for follow up.   ROS: Otherwise negative  Physical Exam VS:  There were no vitals taken for this visit. , BMI There is no height or weight on file to calculate BMI. GEN: Well nourished, well developed, in no acute distress HEENT: normal Neck: no JVD, carotid bruits, or masses Cardiac: ***RRR; no murmurs, rubs, or gallops,no edema  Respiratory:  CTAB bilaterally, normal work of breathing GI: soft, nontender, nondistended, + BS Extremities: No LE edema Skin: warm and dry, no rash Neuro:  Strength and sensation are intact  Recent Labs: Reviewed  ASSESSMENT AND PLAN Kathleen Moyer is a 68 y.o. female who presents for follow up.  - *** - *** - ***    Signed, Joelle VEAR Ren Donley, MD  10/20/2024 2:03 PM    [facility]

## 2024-10-20 NOTE — Telephone Encounter (Signed)
Phone visit completed

## 2024-10-21 ENCOUNTER — Ambulatory Visit

## 2024-10-21 ENCOUNTER — Telehealth: Payer: Self-pay

## 2024-10-21 ENCOUNTER — Other Ambulatory Visit: Payer: Self-pay

## 2024-10-21 VITALS — BP 138/74 | HR 88 | Ht 62.0 in | Wt 121.0 lb

## 2024-10-21 DIAGNOSIS — I10 Essential (primary) hypertension: Secondary | ICD-10-CM | POA: Diagnosis not present

## 2024-10-21 DIAGNOSIS — E7800 Pure hypercholesterolemia, unspecified: Secondary | ICD-10-CM | POA: Diagnosis not present

## 2024-10-21 DIAGNOSIS — Z720 Tobacco use: Secondary | ICD-10-CM | POA: Diagnosis not present

## 2024-10-21 DIAGNOSIS — I739 Peripheral vascular disease, unspecified: Secondary | ICD-10-CM

## 2024-10-21 DIAGNOSIS — Z01818 Encounter for other preprocedural examination: Secondary | ICD-10-CM | POA: Diagnosis not present

## 2024-10-21 DIAGNOSIS — I70235 Atherosclerosis of native arteries of right leg with ulceration of other part of foot: Secondary | ICD-10-CM

## 2024-10-21 NOTE — Telephone Encounter (Signed)
 Attempted to call for surgery scheduling. LVM

## 2024-10-21 NOTE — Patient Instructions (Signed)
 Medication Instructions:  Your physician recommends that you continue on your current medications as directed. Please refer to the Current Medication list given to you today.  *If you need a refill on your cardiac medications before your next appointment, please call your pharmacy*  Lab Work: None ordered If you have labs (blood work) drawn today and your tests are completely normal, you will receive your results only by: MyChart Message (if you have MyChart) OR A paper copy in the mail If you have any lab test that is abnormal or we need to change your treatment, we will call you to review the results.  Testing/Procedures: None ordered  Follow-Up: At [facility], you and your health needs are our priority.  As part of our continuing mission to provide you with exceptional heart care, our providers are all part of one team.  This team includes your primary Cardiologist (physician) and Advanced Practice Providers or APPs (Physician Assistants and Nurse Practitioners) who all work together to provide you with the care you need, when you need it.  Your next appointment:   3 month(s)  Provider:   Joelle VEAR Ren Donley, MD    We recommend signing up for the patient portal called MyChart.  Sign up information is provided on this After Visit Summary.  MyChart is used to connect with patients for Virtual Visits (Telemedicine).  Patients are able to view lab/test results, encounter notes, upcoming appointments, etc.  Non-urgent messages can be sent to your provider as well.   To learn more about what you can do with MyChart, go to [email or web address].

## 2024-10-28 NOTE — Progress Notes (Signed)
 Surgical Instructions   Your procedure is scheduled on January 5. Report to [facility] Main Entrance A at 5:30 A.M., then check in with the Admitting office. Any questions or running late day of surgery: call [phone]  Questions prior to your surgery date: call [phone], Monday-Friday, 8am-4pm. If you experience any cold or flu symptoms such as cough, fever, chills, shortness of breath, etc. between now and your scheduled surgery, please notify us  at the above number.     Remember:  Do not eat or drink anything after midnight the night before your surgery  Take these medicines the morning of surgery with A SIP OF WATER  NEXLETOL   May take these medicines IF NEEDED:NONE  PER DR.ROBINS'S INSTRUCTIONS, HOLD YOUR XARELTO  3 DAYS PRIOR TO SURGERY.TAKE YOUR LAST DOSE ON 11/03/24.   One week prior to surgery, STOP taking any Aspirin  (unless otherwise instructed by your surgeon) Aleve, Naproxen, Ibuprofen, Motrin, Advil, Goody's, BC's, all herbal medications, fish oil, and non-prescription vitamins.                     Do NOT Smoke (Tobacco/Vaping) for 24 hours prior to your procedure.  If you use a CPAP at night, you may bring your mask/headgear for your overnight stay.   You will be asked to remove any contacts, glasses, piercing's, hearing aid's, dentures/partials prior to surgery. Please bring cases for these items if needed.    Patients discharged the day of surgery will not be allowed to drive home, and someone needs to stay with them for 24 hours.  SURGICAL WAITING ROOM VISITATION Patients may have no more than 2 support people in the waiting area - these visitors may rotate.   Pre-op nurse will coordinate an appropriate time for 1 ADULT support person, who may not rotate, to accompany patient in pre-op.  Children under the age of 38 must have an adult with them who is not the patient and must remain in the main waiting area with an adult.  If the patient needs to stay at  the hospital during part of their recovery, the visitor guidelines for inpatient rooms apply.  Please refer to the [facility] website for the visitor guidelines for any additional information.   If you received a COVID test during your pre-op visit  it is requested that you wear a mask when out in public, stay away from anyone that may not be feeling well and notify your surgeon if you develop symptoms. If you have been in contact with anyone that has tested positive in the last 10 days please notify you surgeon.      Pre-operative CHG Bathing Instructions   You can play a key role in reducing the risk of infection after surgery. Your skin needs to be as free of germs as possible. You can reduce the number of germs on your skin by washing with CHG (chlorhexidine gluconate) soap before surgery. CHG is an antiseptic soap that kills germs and continues to kill germs even after washing.   DO NOT use if you have an allergy to chlorhexidine/CHG or antibacterial soaps. If your skin becomes reddened or irritated, stop using the CHG and notify one of our RNs at [phone].              TAKE A SHOWER THE NIGHT BEFORE SURGERY   Please keep in mind the following:  DO NOT shave, including legs and underarms, 48 hours prior to surgery.   You may shave your  face before/day of surgery.  Place clean sheets on your bed the night before surgery Use a clean washcloth (not used since being washed) for shower. DO NOT sleep with pet's night before surgery.  CHG Shower Instructions:  Wash your face and private area with normal soap. If you choose to wash your hair, wash first with your normal shampoo.  After you use shampoo/soap, rinse your hair and body thoroughly to remove shampoo/soap residue.  Turn the water OFF and apply half the bottle of CHG soap to a CLEAN washcloth.  Apply CHG soap ONLY FROM YOUR NECK DOWN TO YOUR TOES (washing for 3-5 minutes)  DO NOT use CHG soap on face, private areas, open  wounds, or sores.  Pay special attention to the area where your surgery is being performed.  If you are having back surgery, having someone wash your back for you may be helpful. Wait 2 minutes after CHG soap is applied, then you may rinse off the CHG soap.  Pat dry with a clean towel  Put on clean pajamas    Additional instructions for the day of surgery: If you choose, you may shower the morning of surgery with an antibacterial soap.  DO NOT APPLY any lotions, deodorants, cologne, or perfumes.   Do not wear jewelry or makeup Do not wear nail polish, gel polish, artificial nails, or any other type of covering on natural nails (fingers and toes) Do not bring valuables to the hospital. [facility] is not responsible for valuables/personal belongings. Put on clean/comfortable clothes.  Please brush your teeth.  Ask your nurse before applying any prescription medications to the skin.

## 2024-10-31 ENCOUNTER — Other Ambulatory Visit: Payer: Self-pay

## 2024-10-31 ENCOUNTER — Encounter (HOSPITAL_COMMUNITY): Payer: Self-pay

## 2024-10-31 ENCOUNTER — Encounter (HOSPITAL_COMMUNITY)
Admission: RE | Admit: 2024-10-31 | Discharge: 2024-10-31 | Disposition: A | Discharge status: Home / Self Care | Source: Ambulatory Visit | Attending: Vascular Surgery | Admitting: Vascular Surgery

## 2024-10-31 VITALS — BP 160/62 | HR 91 | Temp 98.5°F | Resp 16 | Ht 62.0 in | Wt 117.8 lb

## 2024-10-31 DIAGNOSIS — Z01812 Encounter for preprocedural laboratory examination: Secondary | ICD-10-CM | POA: Insufficient documentation

## 2024-10-31 DIAGNOSIS — I10 Essential (primary) hypertension: Secondary | ICD-10-CM | POA: Diagnosis not present

## 2024-10-31 DIAGNOSIS — I70235 Atherosclerosis of native arteries of right leg with ulceration of other part of foot: Secondary | ICD-10-CM | POA: Insufficient documentation

## 2024-10-31 DIAGNOSIS — E785 Hyperlipidemia, unspecified: Secondary | ICD-10-CM | POA: Insufficient documentation

## 2024-10-31 DIAGNOSIS — F17210 Nicotine dependence, cigarettes, uncomplicated: Secondary | ICD-10-CM | POA: Diagnosis not present

## 2024-10-31 DIAGNOSIS — Z01818 Encounter for other preprocedural examination: Secondary | ICD-10-CM

## 2024-10-31 LAB — URINALYSIS, ROUTINE W REFLEX MICROSCOPIC
Glucose, UA: NEGATIVE mg/dL
Ketones, ur: NEGATIVE mg/dL
Nitrite: POSITIVE — AB
Protein, ur: NEGATIVE mg/dL
Specific Gravity, Urine: 1.02 (ref 1.005–1.030)
pH: 6.5 (ref 5.0–8.0)

## 2024-10-31 LAB — CBC
HCT: 37.7 % (ref 36.0–46.0)
Hemoglobin: 12.7 g/dL (ref 12.0–15.0)
MCH: 30.4 pg (ref 26.0–34.0)
MCHC: 33.7 g/dL (ref 30.0–36.0)
MCV: 90.2 fL (ref 80.0–100.0)
Platelets: 322 K/uL (ref 150–400)
RBC: 4.18 MIL/uL (ref 3.87–5.11)
RDW: 13.1 % (ref 11.5–15.5)
WBC: 9.4 K/uL (ref 4.0–10.5)
nRBC: 0 % (ref 0.0–0.2)

## 2024-10-31 LAB — COMPREHENSIVE METABOLIC PANEL WITH GFR
ALT: 11 U/L (ref 0–44)
AST: 20 U/L (ref 15–41)
Albumin: 4.7 g/dL (ref 3.5–5.0)
Alkaline Phosphatase: 49 U/L (ref 38–126)
Anion gap: 14 (ref 5–15)
BUN: 27 mg/dL — ABNORMAL HIGH (ref 8–23)
CO2: 29 mmol/L (ref 22–32)
Calcium: 10 mg/dL (ref 8.9–10.3)
Chloride: 96 mmol/L — ABNORMAL LOW (ref 98–111)
Creatinine, Ser: 1.11 mg/dL — ABNORMAL HIGH (ref 0.44–1.00)
GFR, Estimated: 53 mL/min — ABNORMAL LOW
Glucose, Bld: 101 mg/dL — ABNORMAL HIGH (ref 70–99)
Potassium: 3.4 mmol/L — ABNORMAL LOW (ref 3.5–5.1)
Sodium: 139 mmol/L (ref 135–145)
Total Bilirubin: 0.4 mg/dL (ref 0.0–1.2)
Total Protein: 7.6 g/dL (ref 6.5–8.1)

## 2024-10-31 LAB — SURGICAL PCR SCREEN
MRSA, PCR: NEGATIVE
Staphylococcus aureus: NEGATIVE

## 2024-10-31 LAB — URINALYSIS, MICROSCOPIC (REFLEX)

## 2024-10-31 LAB — APTT: aPTT: 39 s — ABNORMAL HIGH (ref 24–36)

## 2024-10-31 NOTE — Progress Notes (Addendum)
 Surgical Instructions     Your procedure is scheduled on Monday, January 5th 2026. Report to [facility] Main Entrance A at 5:30 A.M., then check in with the Admitting office. Any questions or running late day of surgery: call [phone]   Questions prior to your surgery date: call [phone], Monday-Friday, 8am-4pm. If you experience any cold or flu symptoms such as cough, fever, chills, shortness of breath, etc. between now and your scheduled surgery, please notify us  at the above number.            Remember:       Do not eat or drink anything after midnight the night before your surgery    Take these medicines the morning of surgery with A SIP OF WATER  NEXLETOL  Aspirin     May take these medicines IF NEEDED:NONE    PER DR.ROBINS'S INSTRUCTIONS, HOLD YOUR XARELTO  3 DAYS PRIOR TO SURGERY.TAKE YOUR LAST DOSE ON 11/03/24.     One week prior to surgery, STOP taking any Aleve, Naproxen, Ibuprofen, Motrin, Advil, Goody's, BC's, all herbal medications, fish oil, and non-prescription vitamins.                     Do NOT Smoke (Tobacco/Vaping) for 24 hours prior to your procedure.   If you use a CPAP at night, you may bring your mask/headgear for your overnight stay.   You will be asked to remove any contacts, glasses, piercing's, hearing aid's, dentures/partials prior to surgery. Please bring cases for these items if needed.    Patients discharged the day of surgery will not be allowed to drive home, and someone needs to stay with them for 24 hours.   SURGICAL WAITING ROOM VISITATION Patients may have no more than 2 support people in the waiting area - these visitors may rotate.   Pre-op nurse will coordinate an appropriate time for 1 ADULT support person, who may not rotate, to accompany patient in pre-op.  Children under the age of 42 must have an adult with them who is not the patient and must remain in the main waiting area with an adult.   If the patient needs to stay at  the hospital during part of their recovery, the visitor guidelines for inpatient rooms apply.   Please refer to the [facility] website for the visitor guidelines for any additional information.     If you received a COVID test during your pre-op visit  it is requested that you wear a mask when out in public, stay away from anyone that may not be feeling well and notify your surgeon if you develop symptoms. If you have been in contact with anyone that has tested positive in the last 10 days please notify you surgeon.         Pre-operative CHG Bathing Instructions    You can play a key role in reducing the risk of infection after surgery. Your skin needs to be as free of germs as possible. You can reduce the number of germs on your skin by washing with CHG (chlorhexidine gluconate) soap before surgery. CHG is an antiseptic soap that kills germs and continues to kill germs even after washing.    DO NOT use if you have an allergy to chlorhexidine/CHG or antibacterial soaps. If your skin becomes reddened or irritated, stop using the CHG and notify one of our RNs at [phone].               TAKE A SHOWER  THE NIGHT BEFORE SURGERY    Please keep in mind the following:  DO NOT shave, including legs and underarms, 48 hours prior to surgery.   You may shave your face before/day of surgery.  Place clean sheets on your bed the night before surgery Use a clean washcloth (not used since being washed) for shower. DO NOT sleep with pet's night before surgery.   CHG Shower Instructions:  Wash your face and private area with normal soap. If you choose to wash your hair, wash first with your normal shampoo.  After you use shampoo/soap, rinse your hair and body thoroughly to remove shampoo/soap residue.  Turn the water OFF and apply half the bottle of CHG soap to a CLEAN washcloth.  Apply CHG soap ONLY FROM YOUR NECK DOWN TO YOUR TOES (washing for 3-5 minutes)  DO NOT use CHG soap on face, private  areas, open wounds, or sores.  Pay special attention to the area where your surgery is being performed.  If you are having back surgery, having someone wash your back for you may be helpful. Wait 2 minutes after CHG soap is applied, then you may rinse off the CHG soap.  Pat dry with a clean towel  Put on clean pajamas     Additional instructions for the day of surgery: If you choose, you may shower the morning of surgery with an antibacterial soap.  DO NOT APPLY any lotions, deodorants, cologne, or perfumes.   Do not wear jewelry or makeup Do not wear nail polish, gel polish, artificial nails, or any other type of covering on natural nails (fingers and toes) Do not bring valuables to the hospital. [facility] is not responsible for valuables/personal belongings. Put on clean/comfortable clothes.  Please brush your teeth.  Ask your nurse before applying any prescription medications to the skin.

## 2024-10-31 NOTE — Progress Notes (Addendum)
 PCP - Cheryle Frees Cardiologist - Dempsey Ren Bean  PPM/ICD - denies Device Orders - n/a Rep Notified -  n/a  Chest x-ray - denies EKG - 10/10/24 Stress Test - 10/14/24 ECHO - 10/14/24 Cardiac Cath - denies  Sleep Study - denies CPAP - n/a  No DM - pre-diabetes - A1C on 10/10/24 was 5.8  Last dose of GLP1 agonist-  denies GLP1 instructions: denies  Blood Thinner Instructions: per Dr. Lanis, last dose of Xarelto  should be on 1/1 - patient is aware Aspirin  Instructions: per Dr. Lanis, patient can continue taking Aspirin  - patient is aware  ERAS Protcol - NPO PRE-SURGERY Ensure or G2- n/a  COVID TEST-  n/a   Anesthesia review:  yes - PAD, HTN,   Patient denies shortness of breath, fever, cough and chest pain at PAT appointment   All instructions explained to the patient, with a verbal understanding of the material. Patient agrees to go over the instructions while at home for a better understanding. Patient also instructed to self quarantine after being tested for COVID-19. The opportunity to ask questions was provided.  Message sent to Alan Glance at Dr. Lanis' office to review patient's UA results.  12/30 Update - antibiotic called in for patient and patient was made aware by Alan Glance

## 2024-11-01 ENCOUNTER — Other Ambulatory Visit: Payer: Self-pay

## 2024-11-01 MED ORDER — NITROFURANTOIN MONOHYD MACRO 100 MG PO CAPS: 100.0000 mg | ORAL_CAPSULE | Freq: Two times a day (BID) | ORAL | 0 refills | Status: AC

## 2024-11-01 NOTE — Progress Notes (Addendum)
 Anesthesia Chart Review:  68 year old female with pertinent history including severe PAD, current smoker, HTN, HLD.  Patient seen by cardiologist Dr. Ren Ny on 10/21/2024 for preop evaluation.  Per note, Asymptomatic but activities limited by claudication. Had low risk stress test and TTE with normal function, though had some wall motion abnormalities. I discussed her case with Dr. Silver and while patient may have obstructive CAD, the risk of delaying surgery due to being DAPT could lead to irreversible damage to lower extremities. - Proceed with vascular surgery without additional evaluation - Consider further cardiac evaluation if post-surgery symptoms develop. - Will obtain lipid panel, AAA screening during next visit .  Patient reports last dose Xarelto  will be on 11/03/2024.  Preop labs reviewed, unremarkable.  EKG 10/10/2024: NSR.  Rate 97.  Nuclear stress 10/14/2024:   The study is normal. The study is low risk.   A pharmacological stress test was performed using IV Lexiscan  0.4mg  over 10 seconds performed without concurrent submaximal exercise. Maximum HR of 107 bpm. The patient reported no symptoms during the stress test. Normal blood pressure response noted during stress.   No ST deviation was noted. The ECG was not diagnostic due to pharmacologic protocol.   LV perfusion is normal. There is no evidence of ischemia. There is no evidence of infarction.   Left ventricular function is normal. End diastolic cavity size is normal.   Coronary calcium was present on the attenuation correction CT images. Severe coronary calcifications were present. Coronary calcifications were present in the left anterior descending artery and right coronary artery distribution(s). There is also severe aortic calcifications throughout the entirety of the aorta.  TTE 10/14/2024: 1. Small region of basal inferior/inferoseptal hypokinesis. . Left  ventricular ejection fraction, by estimation, is 60 to  65%. Left  ventricular ejection fraction by 3D volume is 63 %. The left ventricle has  normal function. The left ventricle has no  regional wall motion abnormalities. Left ventricular diastolic parameters  are indeterminate.   2. Right ventricular systolic function is normal. The right ventricular  size is normal.   3. Interatrial septum is aneurysmal. No color flow seen going across  septum.   4. The mitral valve is normal in structure. Trivial mitral valve  regurgitation.   5. The aortic valve is tricuspid. Aortic valve regurgitation is not  visualized. Aortic valve sclerosis/calcification is present, without any  evidence of aortic stenosis.   6. The inferior vena cava is normal in size with greater than 50%  respiratory variability, suggesting right atrial pressure of 3 mmHg.      Lynwood Geofm RIGGERS [facility]/Anesthesiology Phone [phone] 11/01/2024 12:12 PM

## 2024-11-01 NOTE — Anesthesia Preprocedure Evaluation (Addendum)
 "                                  Anesthesia Evaluation  Patient identified by MRN, date of birth, ID band Patient awake    Reviewed: Allergy & Precautions, NPO status , Patient's Chart, lab work & pertinent test results  Airway Mallampati: II  TM Distance: >3 FB Neck ROM: Full    Dental  (+) Edentulous Lower, Edentulous Upper   Pulmonary Current Smoker and Patient abstained from smoking.   Pulmonary exam normal        Cardiovascular hypertension, Pt. on medications + Peripheral Vascular Disease  Normal cardiovascular exam     Neuro/Psych negative neurological ROS     GI/Hepatic negative GI ROS, Neg liver ROS,,,  Endo/Other  negative endocrine ROS    Renal/GU Renal disease     Musculoskeletal negative musculoskeletal ROS (+)    Abdominal   Peds  Hematology  (+) Blood dyscrasia (Xarelto )   Anesthesia Other Findings 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   Reproductive/Obstetrics                              Anesthesia Physical Anesthesia Plan  ASA: 3  Anesthesia Plan: General   Post-op Pain Management:    Induction: Intravenous  PONV Risk Score and Plan: 2 and Ondansetron , Dexamethasone , Midazolam  and Treatment may vary due to age or medical condition  Airway Management Planned: Oral ETT  Additional Equipment: Arterial line, CVP and Ultrasound Guidance Line Placement  Intra-op Plan:   Post-operative Plan: Post-operative intubation/ventilation  Informed Consent: I have reviewed the patients History and Physical, chart, labs and discussed the procedure including the risks, benefits and alternatives for the proposed anesthesia with the patient or authorized representative who has indicated his/her understanding and acceptance.       Plan Discussed with: CRNA and Surgeon  Anesthesia Plan Comments: ([facility]  PAT note by Lynwood Hope, PA-C: 68 year old female with pertinent  history including severe PAD, current smoker, HTN, HLD.  Patient seen by cardiologist Dr. Ren Ny on 10/21/2024 for preop evaluation.  Per note, Asymptomatic but activities limited by claudication. Had low risk stress test and TTE with normal function, though had some wall motion abnormalities. I discussed her case with Dr. Silver and while patient may have obstructive CAD, the risk of delaying surgery due to being DAPT could lead to irreversible damage to lower extremities. - Proceed with vascular surgery without additional evaluation - Consider further cardiac evaluation if post-surgery symptoms develop. - Will obtain lipid panel, AAA screening during next visit .  Patient reports last dose Xarelto  will be on 11/03/2024.  Preop labs reviewed, unremarkable.  EKG 10/10/2024: NSR.  Rate 97.  Nuclear stress 10/14/2024:   The study is normal. The study is low risk.   A pharmacological stress test was performed using IV Lexiscan  0.4mg  over 10 seconds performed without concurrent submaximal exercise. Maximum HR of 107 bpm. The patient reported no symptoms during the stress test. Normal blood pressure response noted during stress.   No ST deviation was noted. The ECG was not diagnostic due to pharmacologic protocol.   LV perfusion is normal. There is no evidence of ischemia. There is no evidence of infarction.   Left ventricular function is normal. End diastolic cavity size is normal.   Coronary calcium was present on the  attenuation correction CT images. Severe coronary calcifications were present. Coronary calcifications were present in the left anterior descending artery and right coronary artery distribution(s). There is also severe aortic calcifications throughout the entirety of the aorta.  TTE 10/14/2024: 1. Small region of basal inferior/inferoseptal hypokinesis. . Left  ventricular ejection fraction, by estimation, is 60 to 65%. Left  ventricular ejection fraction by 3D volume is  63 %. The left ventricle has  normal function. The left ventricle has no  regional wall motion abnormalities. Left ventricular diastolic parameters  are indeterminate.  2. Right ventricular systolic function is normal. The right ventricular  size is normal.  3. Interatrial septum is aneurysmal. No color flow seen going across  septum.  4. The mitral valve is normal in structure. Trivial mitral valve  regurgitation.  5. The aortic valve is tricuspid. Aortic valve regurgitation is not  visualized. Aortic valve sclerosis/calcification is present, without any  evidence of aortic stenosis.  6. The inferior vena cava is normal in size with greater than 50%  respiratory variability, suggesting right atrial pressure of 3 mmHg.    )         Anesthesia Quick Evaluation  "

## 2024-11-02 ENCOUNTER — Other Ambulatory Visit: Payer: Self-pay

## 2024-11-02 DIAGNOSIS — I70235 Atherosclerosis of native arteries of right leg with ulceration of other part of foot: Secondary | ICD-10-CM

## 2024-11-07 ENCOUNTER — Encounter (HOSPITAL_COMMUNITY): Admitting: Physician Assistant

## 2024-11-07 ENCOUNTER — Inpatient Hospital Stay (HOSPITAL_COMMUNITY)

## 2024-11-07 ENCOUNTER — Other Ambulatory Visit: Payer: Self-pay

## 2024-11-07 ENCOUNTER — Encounter (HOSPITAL_COMMUNITY)
Admission: RE | Disposition: A | Discharge status: Home / Self Care | Payer: Self-pay | Source: Home / Self Care | Attending: Vascular Surgery

## 2024-11-07 ENCOUNTER — Encounter (HOSPITAL_COMMUNITY): Payer: Self-pay | Admitting: Vascular Surgery

## 2024-11-07 ENCOUNTER — Inpatient Hospital Stay (HOSPITAL_COMMUNITY)
Admission: RE | Admit: 2024-11-07 | Discharge: 2024-11-12 | DRG: 271 | Disposition: A | Discharge status: Home / Self Care | Attending: Vascular Surgery | Admitting: Vascular Surgery

## 2024-11-07 ENCOUNTER — Inpatient Hospital Stay (HOSPITAL_COMMUNITY): Admitting: Certified Registered Nurse Anesthetist

## 2024-11-07 DIAGNOSIS — L0889 Other specified local infections of the skin and subcutaneous tissue: Secondary | ICD-10-CM | POA: Diagnosis present

## 2024-11-07 DIAGNOSIS — I998 Other disorder of circulatory system: Secondary | ICD-10-CM

## 2024-11-07 DIAGNOSIS — D72829 Elevated white blood cell count, unspecified: Secondary | ICD-10-CM | POA: Diagnosis present

## 2024-11-07 DIAGNOSIS — Z716 Tobacco abuse counseling: Secondary | ICD-10-CM | POA: Diagnosis not present

## 2024-11-07 DIAGNOSIS — I700 Atherosclerosis of aorta: Secondary | ICD-10-CM | POA: Diagnosis present

## 2024-11-07 DIAGNOSIS — I70223 Atherosclerosis of native arteries of extremities with rest pain, bilateral legs: Secondary | ICD-10-CM | POA: Diagnosis present

## 2024-11-07 DIAGNOSIS — Z8249 Family history of ischemic heart disease and other diseases of the circulatory system: Secondary | ICD-10-CM

## 2024-11-07 DIAGNOSIS — F17210 Nicotine dependence, cigarettes, uncomplicated: Secondary | ICD-10-CM | POA: Diagnosis not present

## 2024-11-07 DIAGNOSIS — L97519 Non-pressure chronic ulcer of other part of right foot with unspecified severity: Secondary | ICD-10-CM | POA: Diagnosis present

## 2024-11-07 DIAGNOSIS — E7800 Pure hypercholesterolemia, unspecified: Secondary | ICD-10-CM

## 2024-11-07 DIAGNOSIS — Z7901 Long term (current) use of anticoagulants: Secondary | ICD-10-CM | POA: Diagnosis not present

## 2024-11-07 DIAGNOSIS — Z952 Presence of prosthetic heart valve: Secondary | ICD-10-CM

## 2024-11-07 DIAGNOSIS — Z7982 Long term (current) use of aspirin: Secondary | ICD-10-CM | POA: Diagnosis not present

## 2024-11-07 DIAGNOSIS — Z79899 Other long term (current) drug therapy: Secondary | ICD-10-CM | POA: Diagnosis not present

## 2024-11-07 DIAGNOSIS — I70221 Atherosclerosis of native arteries of extremities with rest pain, right leg: Principal | ICD-10-CM | POA: Diagnosis present

## 2024-11-07 DIAGNOSIS — F17200 Nicotine dependence, unspecified, uncomplicated: Secondary | ICD-10-CM | POA: Diagnosis present

## 2024-11-07 DIAGNOSIS — Z90710 Acquired absence of both cervix and uterus: Secondary | ICD-10-CM | POA: Diagnosis not present

## 2024-11-07 DIAGNOSIS — I10 Essential (primary) hypertension: Secondary | ICD-10-CM | POA: Diagnosis present

## 2024-11-07 DIAGNOSIS — I70235 Atherosclerosis of native arteries of right leg with ulceration of other part of foot: Secondary | ICD-10-CM | POA: Diagnosis present

## 2024-11-07 DIAGNOSIS — D696 Thrombocytopenia, unspecified: Secondary | ICD-10-CM | POA: Diagnosis present

## 2024-11-07 DIAGNOSIS — Z9889 Other specified postprocedural states: Secondary | ICD-10-CM | POA: Diagnosis not present

## 2024-11-07 DIAGNOSIS — D62 Acute posthemorrhagic anemia: Secondary | ICD-10-CM | POA: Diagnosis not present

## 2024-11-07 DIAGNOSIS — E785 Hyperlipidemia, unspecified: Secondary | ICD-10-CM | POA: Diagnosis not present

## 2024-11-07 DIAGNOSIS — Z87891 Personal history of nicotine dependence: Secondary | ICD-10-CM | POA: Diagnosis not present

## 2024-11-07 DIAGNOSIS — I7409 Other arterial embolism and thrombosis of abdominal aorta: Secondary | ICD-10-CM | POA: Diagnosis present

## 2024-11-07 DIAGNOSIS — Z823 Family history of stroke: Secondary | ICD-10-CM | POA: Diagnosis not present

## 2024-11-07 DIAGNOSIS — K567 Ileus, unspecified: Secondary | ICD-10-CM | POA: Diagnosis not present

## 2024-11-07 DIAGNOSIS — I70321 Atherosclerosis of unspecified type of bypass graft(s) of the extremities with rest pain, right leg: Secondary | ICD-10-CM | POA: Diagnosis not present

## 2024-11-07 DIAGNOSIS — I739 Peripheral vascular disease, unspecified: Secondary | ICD-10-CM | POA: Diagnosis not present

## 2024-11-07 HISTORY — PX: LYSIS OF ADHESION: SHX5961

## 2024-11-07 HISTORY — PX: AORTA - BILATERAL FEMORAL ARTERY BYPASS GRAFT: SHX1175

## 2024-11-07 HISTORY — PX: ENDARTERECTOMY FEMORAL: SHX5804

## 2024-11-07 HISTORY — PX: INTRAOPERATIVE ARTERIOGRAM: SHX5157

## 2024-11-07 LAB — POCT I-STAT 7, (LYTES, BLD GAS, ICA,H+H)
Acid-Base Excess: 2 mmol/L (ref 0.0–2.0)
Acid-Base Excess: 2 mmol/L (ref 0.0–2.0)
Acid-base deficit: 3 mmol/L — ABNORMAL HIGH (ref 0.0–2.0)
Acid-base deficit: 3 mmol/L — ABNORMAL HIGH (ref 0.0–2.0)
Bicarbonate: 25.2 mmol/L (ref 20.0–28.0)
Bicarbonate: 22.4 mmol/L (ref 20.0–28.0)
Bicarbonate: 26.3 mmol/L (ref 20.0–28.0)
Bicarbonate: 21.4 mmol/L (ref 20.0–28.0)
Calcium, Ion: 1.21 mmol/L (ref 1.15–1.40)
Calcium, Ion: 1.1 mmol/L — ABNORMAL LOW (ref 1.15–1.40)
Calcium, Ion: 1.21 mmol/L (ref 1.15–1.40)
Calcium, Ion: 1.06 mmol/L — ABNORMAL LOW (ref 1.15–1.40)
HCT: 28 % — ABNORMAL LOW (ref 36.0–46.0)
HCT: 22 % — ABNORMAL LOW (ref 36.0–46.0)
HCT: 33 % — ABNORMAL LOW (ref 36.0–46.0)
HCT: 33 % — ABNORMAL LOW (ref 36.0–46.0)
Hemoglobin: 9.5 g/dL — ABNORMAL LOW (ref 12.0–15.0)
Hemoglobin: 11.2 g/dL — ABNORMAL LOW (ref 12.0–15.0)
Hemoglobin: 7.5 g/dL — ABNORMAL LOW (ref 12.0–15.0)
Hemoglobin: 11.2 g/dL — ABNORMAL LOW (ref 12.0–15.0)
O2 Saturation: 100 %
O2 Saturation: 100 %
O2 Saturation: 100 %
O2 Saturation: 100 %
Patient temperature: 36
Patient temperature: 36.3
Patient temperature: 36.6
Patient temperature: 36.2
Potassium: 3.5 mmol/L (ref 3.5–5.1)
Potassium: 3.5 mmol/L (ref 3.5–5.1)
Potassium: 3.9 mmol/L (ref 3.5–5.1)
Potassium: 4 mmol/L (ref 3.5–5.1)
Sodium: 137 mmol/L (ref 135–145)
Sodium: 136 mmol/L (ref 135–145)
Sodium: 137 mmol/L (ref 135–145)
Sodium: 138 mmol/L (ref 135–145)
TCO2: 26 mmol/L (ref 22–32)
TCO2: 24 mmol/L (ref 22–32)
TCO2: 28 mmol/L (ref 22–32)
TCO2: 22 mmol/L (ref 22–32)
pCO2 arterial: 32.1 mmHg (ref 32–48)
pCO2 arterial: 39.5 mmHg (ref 32–48)
pCO2 arterial: 39.6 mmHg (ref 32–48)
pCO2 arterial: 34.7 mmHg (ref 32–48)
pH, Arterial: 7.498 — ABNORMAL HIGH (ref 7.35–7.45)
pH, Arterial: 7.359 (ref 7.35–7.45)
pH, Arterial: 7.429 (ref 7.35–7.45)
pH, Arterial: 7.394 (ref 7.35–7.45)
pO2, Arterial: 256 mmHg — ABNORMAL HIGH (ref 83–108)
pO2, Arterial: 267 mmHg — ABNORMAL HIGH (ref 83–108)
pO2, Arterial: 276 mmHg — ABNORMAL HIGH (ref 83–108)
pO2, Arterial: 283 mmHg — ABNORMAL HIGH (ref 83–108)

## 2024-11-07 LAB — CBC
HCT: 38.4 % (ref 36.0–46.0)
HCT: 38.5 % (ref 36.0–46.0)
Hemoglobin: 13.2 g/dL (ref 12.0–15.0)
Hemoglobin: 13.3 g/dL (ref 12.0–15.0)
MCH: 30.6 pg (ref 26.0–34.0)
MCH: 30 pg (ref 26.0–34.0)
MCHC: 34.4 g/dL (ref 30.0–36.0)
MCHC: 34.5 g/dL (ref 30.0–36.0)
MCV: 88.9 fL (ref 80.0–100.0)
MCV: 86.9 fL (ref 80.0–100.0)
Platelets: 181 K/uL (ref 150–400)
Platelets: 187 K/uL (ref 150–400)
RBC: 4.32 MIL/uL (ref 3.87–5.11)
RBC: 4.43 MIL/uL (ref 3.87–5.11)
RDW: 13.3 % (ref 11.5–15.5)
RDW: 13.5 % (ref 11.5–15.5)
WBC: 14.2 K/uL — ABNORMAL HIGH (ref 4.0–10.5)
WBC: 13.8 K/uL — ABNORMAL HIGH (ref 4.0–10.5)
nRBC: 0 % (ref 0.0–0.2)
nRBC: 0 % (ref 0.0–0.2)

## 2024-11-07 LAB — APTT
aPTT: 32 s (ref 24–36)
aPTT: 29 s (ref 24–36)

## 2024-11-07 LAB — BLOOD GAS, ARTERIAL
Acid-base deficit: 1.3 mmol/L (ref 0.0–2.0)
Bicarbonate: 24.8 mmol/L (ref 20.0–28.0)
Drawn by: 51214
O2 Saturation: 99.8 %
Patient temperature: 37.2
pCO2 arterial: 46 mmHg (ref 32–48)
pH, Arterial: 7.34 — ABNORMAL LOW (ref 7.35–7.45)
pO2, Arterial: 110 mmHg — ABNORMAL HIGH (ref 83–108)

## 2024-11-07 LAB — MAGNESIUM
Magnesium: 1.3 mg/dL — ABNORMAL LOW (ref 1.7–2.4)
Magnesium: 1.2 mg/dL — ABNORMAL LOW (ref 1.7–2.4)

## 2024-11-07 LAB — COMPREHENSIVE METABOLIC PANEL WITH GFR
ALT: 9 U/L (ref 0–44)
AST: 19 U/L (ref 15–41)
Albumin: 3.8 g/dL (ref 3.5–5.0)
Alkaline Phosphatase: 27 U/L — ABNORMAL LOW (ref 38–126)
Anion gap: 11 (ref 5–15)
BUN: 19 mg/dL (ref 8–23)
CO2: 24 mmol/L (ref 22–32)
Calcium: 8.2 mg/dL — ABNORMAL LOW (ref 8.9–10.3)
Chloride: 100 mmol/L (ref 98–111)
Creatinine, Ser: 0.89 mg/dL (ref 0.44–1.00)
GFR, Estimated: >60 mL/min
Glucose, Bld: 164 mg/dL — ABNORMAL HIGH (ref 70–99)
Potassium: 4.4 mmol/L (ref 3.5–5.1)
Sodium: 135 mmol/L (ref 135–145)
Total Bilirubin: 0.4 mg/dL (ref 0.0–1.2)
Total Protein: 5.4 g/dL — ABNORMAL LOW (ref 6.5–8.1)

## 2024-11-07 LAB — ABO/RH: ABO/RH(D): A NEG

## 2024-11-07 LAB — POCT ACTIVATED CLOTTING TIME
Activated Clotting Time: 199 s
Activated Clotting Time: 225 s
Activated Clotting Time: 245 s
Activated Clotting Time: 255 s
Activated Clotting Time: 209 s

## 2024-11-07 LAB — PREPARE RBC (CROSSMATCH)

## 2024-11-07 LAB — PROTIME-INR
INR: 1 (ref 0.8–1.2)
Prothrombin Time: 14.1 s (ref 11.4–15.2)

## 2024-11-07 MED ORDER — POTASSIUM CHLORIDE 10 MEQ/100ML IV SOLN
10.0000 meq | Freq: Every day | INTRAVENOUS | Status: DC | PRN
  Administered 2024-11-08: 10 meq via INTRAVENOUS
  Filled 2024-11-07: qty 100

## 2024-11-07 MED ORDER — 0.9 % SODIUM CHLORIDE (POUR BTL) OPTIME
TOPICAL | Status: DC | PRN
  Administered 2024-11-07: 2000 mL

## 2024-11-07 MED ORDER — ONDANSETRON HCL 4 MG/2ML IJ SOLN: 4.0000 mg | Freq: Four times a day (QID) | INTRAMUSCULAR | Status: DC | PRN

## 2024-11-07 MED ORDER — CEFAZOLIN SODIUM-DEXTROSE 2-4 GM/100ML-% IV SOLN
2.0000 g | INTRAVENOUS | Status: AC
  Administered 2024-11-07: 2 g via INTRAVENOUS
  Filled 2024-11-07: qty 100

## 2024-11-07 MED ORDER — PROPOFOL 10 MG/ML IV BOLUS
INTRAVENOUS | Status: DC | PRN
  Administered 2024-11-07: 20 mg via INTRAVENOUS
  Administered 2024-11-07: 50 mg via INTRAVENOUS
  Administered 2024-11-07: 20 mg via INTRAVENOUS
  Administered 2024-11-07: 150 mg via INTRAVENOUS
  Administered 2024-11-07: 20 mg via INTRAVENOUS

## 2024-11-07 MED ORDER — OXIDIZED CELLULOSE EX PADS
MEDICATED_PAD | CUTANEOUS | Status: DC | PRN
  Administered 2024-11-07 (×2): 1 via TOPICAL
  Administered 2024-11-07: 2 via TOPICAL
  Administered 2024-11-07: 1 via TOPICAL

## 2024-11-07 MED ORDER — ORAL CARE MOUTH RINSE: 15.0000 mL | Freq: Once | OROMUCOSAL | Status: AC

## 2024-11-07 MED ORDER — SENNOSIDES-DOCUSATE SODIUM 8.6-50 MG PO TABS
1.0000 | ORAL_TABLET | Freq: Every evening | ORAL | Status: DC | PRN
  Administered 2024-11-08: 1 via NASOGASTRIC
  Filled 2024-11-07: qty 1

## 2024-11-07 MED ORDER — HEPARIN 6000 UNIT IRRIGATION SOLUTION
Status: DC | PRN
  Administered 2024-11-07: 1

## 2024-11-07 MED ORDER — CHLORHEXIDINE GLUCONATE 0.12 % MT SOLN
15.0000 mL | Freq: Once | OROMUCOSAL | Status: AC
  Administered 2024-11-07: 15 mL via OROMUCOSAL
  Filled 2024-11-07: qty 15

## 2024-11-07 MED ORDER — CHLORHEXIDINE GLUCONATE CLOTH 2 % EX PADS: 6.0000 | MEDICATED_PAD | Freq: Once | CUTANEOUS | Status: DC

## 2024-11-07 MED ORDER — AMISULPRIDE (ANTIEMETIC) 5 MG/2ML IV SOLN: 10.0000 mg | Freq: Once | INTRAVENOUS | Status: DC | PRN

## 2024-11-07 MED ORDER — LACTATED RINGERS IV SOLN: INTRAVENOUS | Status: AC

## 2024-11-07 MED ORDER — MIDAZOLAM HCL (PF) 2 MG/2ML IJ SOLN
INTRAMUSCULAR | Status: DC | PRN
  Administered 2024-11-07 (×2): 1 mg via INTRAVENOUS

## 2024-11-07 MED ORDER — HYDROMORPHONE HCL 1 MG/ML IJ SOLN
1.0000 mg | INTRAMUSCULAR | Status: DC | PRN
  Administered 2024-11-08 – 2024-11-10 (×4): 1 mg via INTRAVENOUS
  Filled 2024-11-07 (×4): qty 1

## 2024-11-07 MED ORDER — CEFAZOLIN SODIUM-DEXTROSE 2-4 GM/100ML-% IV SOLN
2.0000 g | Freq: Three times a day (TID) | INTRAVENOUS | Status: AC
  Administered 2024-11-07 – 2024-11-08 (×2): 2 g via INTRAVENOUS
  Filled 2024-11-07 (×2): qty 100

## 2024-11-07 MED ORDER — PHENYLEPHRINE 80 MCG/ML (10ML) SYRINGE FOR IV PUSH (FOR BLOOD PRESSURE SUPPORT)
PREFILLED_SYRINGE | INTRAVENOUS | Status: DC | PRN
  Administered 2024-11-07: 40 ug via INTRAVENOUS
  Administered 2024-11-07: 80 ug via INTRAVENOUS
  Administered 2024-11-07: 40 ug via INTRAVENOUS
  Administered 2024-11-07: 80 ug via INTRAVENOUS
  Administered 2024-11-07: 160 ug via INTRAVENOUS
  Administered 2024-11-07: 80 ug via INTRAVENOUS

## 2024-11-07 MED ORDER — HYDROCHLOROTHIAZIDE 12.5 MG PO TABS
12.5000 mg | ORAL_TABLET | Freq: Every day | ORAL | Status: DC
  Administered 2024-11-08 – 2024-11-09 (×2): 12.5 mg via NASOGASTRIC
  Filled 2024-11-07 (×2): qty 1

## 2024-11-07 MED ORDER — FENTANYL CITRATE (PF) 250 MCG/5ML IJ SOLN
INTRAMUSCULAR | Status: AC
  Filled 2024-11-07: qty 5

## 2024-11-07 MED ORDER — ROCURONIUM BROMIDE 10 MG/ML (PF) SYRINGE
PREFILLED_SYRINGE | INTRAVENOUS | Status: AC
  Filled 2024-11-07: qty 10

## 2024-11-07 MED ORDER — LIDOCAINE 2% (20 MG/ML) 5 ML SYRINGE
INTRAMUSCULAR | Status: AC
  Filled 2024-11-07: qty 5

## 2024-11-07 MED ORDER — HEPARIN 6000 UNIT IRRIGATION SOLUTION
Status: AC
  Filled 2024-11-07: qty 500

## 2024-11-07 MED ORDER — LACTATED RINGERS IV SOLN: INTRAVENOUS | Status: DC | PRN

## 2024-11-07 MED ORDER — PHENYLEPHRINE HCL-NACL 20-0.9 MG/250ML-% IV SOLN
INTRAVENOUS | Status: DC | PRN
  Administered 2024-11-07: 25 ug/min via INTRAVENOUS

## 2024-11-07 MED ORDER — SUGAMMADEX SODIUM 200 MG/2ML IV SOLN
INTRAVENOUS | Status: DC | PRN
  Administered 2024-11-07: 200 mg via INTRAVENOUS

## 2024-11-07 MED ORDER — PROTAMINE SULFATE 10 MG/ML IV SOLN
INTRAVENOUS | Status: DC | PRN
  Administered 2024-11-07 (×2): 20 mg via INTRAVENOUS
  Administered 2024-11-07: 10 mg via INTRAVENOUS

## 2024-11-07 MED ORDER — IRBESARTAN 150 MG PO TABS
150.0000 mg | ORAL_TABLET | Freq: Every day | ORAL | Status: DC
  Administered 2024-11-08: 150 mg via NASOGASTRIC
  Filled 2024-11-07 (×2): qty 1

## 2024-11-07 MED ORDER — PROPOFOL 10 MG/ML IV BOLUS
INTRAVENOUS | Status: AC
  Filled 2024-11-07: qty 20

## 2024-11-07 MED ORDER — OXYCODONE HCL 5 MG PO TABS
5.0000 mg | ORAL_TABLET | ORAL | Status: DC | PRN
  Administered 2024-11-08 – 2024-11-10 (×2): 10 mg via NASOGASTRIC
  Filled 2024-11-07 (×2): qty 2

## 2024-11-07 MED ORDER — HEPARIN SODIUM (PORCINE) 1000 UNIT/ML IJ SOLN
INTRAMUSCULAR | Status: AC
  Filled 2024-11-07: qty 10

## 2024-11-07 MED ORDER — LIDOCAINE 2% (20 MG/ML) 5 ML SYRINGE
INTRAMUSCULAR | Status: DC | PRN
  Administered 2024-11-07: 60 mg via INTRAVENOUS

## 2024-11-07 MED ORDER — MIDAZOLAM HCL 2 MG/2ML IJ SOLN
INTRAMUSCULAR | Status: AC
  Filled 2024-11-07: qty 2

## 2024-11-07 MED ORDER — FAMOTIDINE IN NACL 20-0.9 MG/50ML-% IV SOLN
20.0000 mg | Freq: Two times a day (BID) | INTRAVENOUS | Status: DC
  Administered 2024-11-07 – 2024-11-09 (×4): 20 mg via INTRAVENOUS
  Filled 2024-11-07 (×4): qty 50

## 2024-11-07 MED ORDER — DEXAMETHASONE SOD PHOSPHATE PF 10 MG/ML IJ SOLN
INTRAMUSCULAR | Status: DC | PRN
  Administered 2024-11-07: 10 mg via INTRAVENOUS

## 2024-11-07 MED ORDER — PHENOL 1.4 % MT LIQD
1.0000 | OROMUCOSAL | Status: DC | PRN
  Administered 2024-11-08: 1 via OROMUCOSAL
  Filled 2024-11-07: qty 177

## 2024-11-07 MED ORDER — MAGNESIUM SULFATE 2 GM/50ML IV SOLN
2.0000 g | Freq: Every day | INTRAVENOUS | Status: DC | PRN
  Administered 2024-11-07 – 2024-11-08 (×2): 2 g via INTRAVENOUS
  Filled 2024-11-07: qty 50
  Filled 2024-11-07: qty 100

## 2024-11-07 MED ORDER — METOPROLOL TARTRATE 5 MG/5ML IV SOLN
2.5000 mg | INTRAVENOUS | Status: DC | PRN
  Administered 2024-11-07: 5 mg via INTRAVENOUS
  Filled 2024-11-07 (×2): qty 5

## 2024-11-07 MED ORDER — CLEVIDIPINE BUTYRATE 0.5 MG/ML IV EMUL
0.0000 mg/h | INTRAVENOUS | Status: DC
  Administered 2024-11-07: 4 mg/h via INTRAVENOUS
  Administered 2024-11-08: 8 mg/h via INTRAVENOUS
  Administered 2024-11-09: 2 mg/h via INTRAVENOUS
  Filled 2024-11-07 (×4): qty 100

## 2024-11-07 MED ORDER — CLEVIDIPINE BUTYRATE 0.5 MG/ML IV EMUL
INTRAVENOUS | Status: DC | PRN
  Administered 2024-11-07: 2 mg/h via INTRAVENOUS

## 2024-11-07 MED ORDER — ONDANSETRON HCL 4 MG/2ML IJ SOLN
INTRAMUSCULAR | Status: AC
  Filled 2024-11-07: qty 2

## 2024-11-07 MED ORDER — DEXMEDETOMIDINE HCL IN NACL 80 MCG/20ML IV SOLN
INTRAVENOUS | Status: DC | PRN
  Administered 2024-11-07: 4 ug via INTRAVENOUS

## 2024-11-07 MED ORDER — ACETAMINOPHEN 650 MG RE SUPP
325.0000 mg | RECTAL | Status: DC | PRN
  Filled 2024-11-07: qty 1

## 2024-11-07 MED ORDER — IODIXANOL 320 MG/ML IV SOLN
INTRAVENOUS | Status: DC | PRN
  Administered 2024-11-07: 100 mL via INTRA_ARTERIAL
  Administered 2024-11-07: 20 mL via INTRA_ARTERIAL

## 2024-11-07 MED ORDER — LACTATED RINGERS IV SOLN: INTRAVENOUS | Status: DC

## 2024-11-07 MED ORDER — SODIUM CHLORIDE 0.9 % IV SOLN: 500.0000 mL | Freq: Once | INTRAVENOUS | Status: DC | PRN

## 2024-11-07 MED ORDER — FENTANYL CITRATE (PF) 100 MCG/2ML IJ SOLN: 25.0000 ug | INTRAMUSCULAR | Status: DC | PRN

## 2024-11-07 MED ORDER — ONDANSETRON HCL 4 MG/2ML IJ SOLN
INTRAMUSCULAR | Status: DC | PRN
  Administered 2024-11-07: 4 mg via INTRAVENOUS

## 2024-11-07 MED ORDER — PHENYLEPHRINE 80 MCG/ML (10ML) SYRINGE FOR IV PUSH (FOR BLOOD PRESSURE SUPPORT)
PREFILLED_SYRINGE | INTRAVENOUS | Status: AC
  Filled 2024-11-07: qty 10

## 2024-11-07 MED ORDER — SURGIFLO WITH THROMBIN (HEMOSTATIC MATRIX KIT) OPTIME
TOPICAL | Status: DC | PRN
  Administered 2024-11-07: 4 via TOPICAL

## 2024-11-07 MED ORDER — SODIUM CHLORIDE 0.9 % IV SOLN: INTRAVENOUS | Status: AC

## 2024-11-07 MED ORDER — SODIUM CHLORIDE 0.9 % IV SOLN: INTRAVENOUS | Status: DC

## 2024-11-07 MED ORDER — ROCURONIUM BROMIDE 10 MG/ML (PF) SYRINGE
PREFILLED_SYRINGE | INTRAVENOUS | Status: DC | PRN
  Administered 2024-11-07: 10 mg via INTRAVENOUS
  Administered 2024-11-07: 40 mg via INTRAVENOUS
  Administered 2024-11-07: 60 mg via INTRAVENOUS

## 2024-11-07 MED ORDER — HEPARIN SODIUM (PORCINE) 5000 UNIT/ML IJ SOLN
5000.0000 [IU] | Freq: Three times a day (TID) | INTRAMUSCULAR | Status: DC
  Administered 2024-11-08 – 2024-11-12 (×13): 5000 [IU] via SUBCUTANEOUS
  Filled 2024-11-07 (×14): qty 1

## 2024-11-07 MED ORDER — FENTANYL CITRATE (PF) 250 MCG/5ML IJ SOLN
INTRAMUSCULAR | Status: DC | PRN
  Administered 2024-11-07 (×4): 50 ug via INTRAVENOUS
  Administered 2024-11-07: 100 ug via INTRAVENOUS
  Administered 2024-11-07 (×4): 50 ug via INTRAVENOUS

## 2024-11-07 MED ORDER — ONDANSETRON HCL 4 MG/2ML IJ SOLN: 4.0000 mg | Freq: Once | INTRAMUSCULAR | Status: DC | PRN

## 2024-11-07 MED ORDER — PROTAMINE SULFATE 10 MG/ML IV SOLN
INTRAVENOUS | Status: AC
  Filled 2024-11-07: qty 5

## 2024-11-07 MED ORDER — CHLORHEXIDINE GLUCONATE CLOTH 2 % EX PADS
6.0000 | MEDICATED_PAD | Freq: Every day | CUTANEOUS | Status: DC
  Administered 2024-11-07 – 2024-11-12 (×6): 6 via TOPICAL

## 2024-11-07 MED ORDER — ALBUMIN HUMAN 5 % IV SOLN: INTRAVENOUS | Status: DC | PRN

## 2024-11-07 MED ORDER — HEMOSTATIC AGENTS (NO CHARGE) OPTIME
TOPICAL | Status: DC | PRN
  Administered 2024-11-07: 1 via TOPICAL

## 2024-11-07 MED ORDER — ACETAMINOPHEN 10 MG/ML IV SOLN: 1000.0000 mg | Freq: Once | INTRAVENOUS | Status: DC | PRN

## 2024-11-07 MED ORDER — HEPARIN SODIUM (PORCINE) 1000 UNIT/ML IJ SOLN
INTRAMUSCULAR | Status: DC | PRN
  Administered 2024-11-07: 1000 [IU] via INTRAVENOUS
  Administered 2024-11-07: 5000 [IU] via INTRAVENOUS
  Administered 2024-11-07: 3000 [IU] via INTRAVENOUS
  Administered 2024-11-07 (×2): 2000 [IU] via INTRAVENOUS

## 2024-11-07 MED ORDER — ASPIRIN 81 MG PO CHEW
81.0000 mg | CHEWABLE_TABLET | Freq: Once | ORAL | Status: AC
  Administered 2024-11-08: 81 mg via NASOGASTRIC
  Filled 2024-11-07: qty 1

## 2024-11-07 MED ORDER — EPHEDRINE SULFATE-NACL 50-0.9 MG/10ML-% IV SOSY
PREFILLED_SYRINGE | INTRAVENOUS | Status: DC | PRN
  Administered 2024-11-07: 5 mg via INTRAVENOUS

## 2024-11-07 MED ORDER — SODIUM CHLORIDE 0.9% IV SOLUTION: Freq: Once | INTRAVENOUS | Status: DC

## 2024-11-07 NOTE — Anesthesia Procedure Notes (Addendum)
 Central Venous Catheter Insertion Performed by: Patrisha Bernardino SQUIBB, MD, anesthesiologist Start/End1/03/2025 7:00 AM, 11/07/2024 7:15 AM Patient location: Pre-op. Preanesthetic checklist: patient identified, IV checked, site marked, risks and benefits discussed, surgical consent, monitors and equipment checked, pre-op evaluation, timeout performed and anesthesia consent Position: Trendelenburg Lidocaine  1% used for infiltration and patient sedated Hand hygiene performed , maximum sterile barriers used  and Seldinger technique used Catheter size: 8.5 Fr Total catheter length 10. Sheath introducer Procedure performed using ultrasound to evaluate access site. Ultrasound Notes:relevant anatomy identified, ultrasound used to visualize needle entry, vessel patent under ultrasound and image(s) printed for medical record. Attempts: 1 Following insertion, line sutured and dressing applied. Post procedure assessment: blood return through all ports, free fluid flow and no air  Patient tolerated the procedure well with no immediate complications. Additional procedure comments: Arrow three lumen CVC catheter placed into PA port.

## 2024-11-07 NOTE — Anesthesia Postprocedure Evaluation (Signed)
"   Anesthesia Post Note  Patient: Kathleen Moyer  Procedure(s) Performed: CREATION, BYPASS, ARTERIAL, AORTA TO FEMORAL, BILATERAL, USING GRAFT (Bilateral) LYSIS OF ADHESIONS (Abdomen) INTRA OPERATIVE ARTERIOGRAM (Right: Groin) ENDARTERECTOMY, FEMORAL (Bilateral: Groin)     Patient location during evaluation: PACU Anesthesia Type: General Level of consciousness: awake Pain management: pain level controlled Vital Signs Assessment: post-procedure vital signs reviewed and stable Respiratory status: spontaneous breathing, nonlabored ventilation and respiratory function stable Cardiovascular status: blood pressure returned to baseline and stable Postop Assessment: no apparent nausea or vomiting Anesthetic complications: no   No notable events documented.  Last Vitals:  Vitals:   11/07/24 2000 11/07/24 2037  BP: (!) 127/57   Pulse: 85 89  Resp: 16 18  Temp:    SpO2: 100% 99%    Last Pain:  Vitals:   11/07/24 1950  TempSrc: Oral  PainSc:                  Corinn Stoltzfus P Monserath Neff      "
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
## 2024-11-07 NOTE — Anesthesia Procedure Notes (Signed)
 Arterial Line Insertion Start/End1/03/2025 7:05 AM, 11/07/2024 7:15 AM Performed by: Richarda Silvano HERO, CRNA, CRNA  Patient location: Pre-op. Preanesthetic checklist: patient identified, IV checked, site marked, risks and benefits discussed, surgical consent, monitors and equipment checked, pre-op evaluation, timeout performed and anesthesia consent Lidocaine  1% used for infiltration Left, radial was placed Catheter size: 20 G Hand hygiene performed  and maximum sterile barriers used   Attempts: 2 Following insertion, dressing applied and Biopatch. Post procedure assessment: normal and unchanged  Patient tolerated the procedure well with no immediate complications.

## 2024-11-07 NOTE — Progress Notes (Signed)
 Night Critical Care and Cross coverage    Interval/subjective  Resting comfortably  Denies pain   Objective   BP (!) 124/53   Pulse 89   Temp 97.7 F (36.5 C) (Oral)   Resp 16   Ht 5' 2 (1.575 m)   Wt 53.1 kg   SpO2 100%   BMI 21.40 kg/m    CO:  [5.8 L/min-7.3 L/min] 6.2 L/min CI:  [3.8 L/min/m2-4.8 L/min/m2] 4.1 L/min/m2  I/O last 3 completed shifts: In: 4833.6 [I.V.:2753.6; Blood:1130; IV Piggyback:950] Out: 1655 [Urine:455; Blood:1200] Total I/O In: 1152.5 [I.V.:1152.5] Out: 500 [Urine:500]   sodium chloride      sodium chloride  100 mL/hr at 11/07/24 2100    ceFAZolin  (ANCEF ) IV Stopped (11/07/24 1834)   clevidipine  6 mg/hr (11/07/24 2100)   famotidine  (PEPCID ) IV 20 mg (11/07/24 2107)   lactated ringers  Stopped (11/07/24 1958)   magnesium  sulfate bolus IVPB     potassium chloride       Exam Resting in bed. No acute distress HENT NCAT Pulm clear Card rrr Abd soft Ext left DP palpable Right DP audible via doppler, both extremities are warm w/out paresthesia      Latest Ref Rng & Units 11/07/2024    8:55 PM 11/07/2024    1:50 PM 11/07/2024   11:38 AM  CBC  WBC 4.0 - 10.5 K/uL 13.8  14.2    Hemoglobin 12.0 - 15.0 g/dL 86.6  86.7  88.7   Hematocrit 36.0 - 46.0 % 38.5  38.4  33.0   Platelets 150 - 400 K/uL 187  181         Latest Ref Rng & Units 11/07/2024    8:55 PM 11/07/2024   11:38 AM 11/07/2024   10:28 AM  BMP  Glucose 70 - 99 mg/dL 835     BUN 8 - 23 mg/dL 19     Creatinine 9.55 - 1.00 mg/dL 9.10     Sodium 864 - 854 mmol/L 135  138  137   Potassium 3.5 - 5.1 mmol/L 4.4  4.0  3.5   Chloride 98 - 111 mmol/L 100     CO2 22 - 32 mmol/L 24     Calcium 8.9 - 10.3 mg/dL 8.2        Impression and plan   Severe PAD w/ ischemic right Lower extremity now s/p aortobifemoral bypass and bilateral common femoral endarterectomy  -pulse checks remain unchanged from earlier  Plan Cont serial vascular checks PRN pain control  SBP goal <140
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
## 2024-11-07 NOTE — Anesthesia Procedure Notes (Signed)
 Procedure Name: Intubation Date/Time: 11/07/2024 8:00 AM  Performed by: Carolee Lauraine DASEN, CRNAPre-anesthesia Checklist: Patient identified, Emergency Drugs available, Suction available and Patient being monitored Patient Re-evaluated:Patient Re-evaluated prior to induction Oxygen Delivery Method: Circle System Utilized Preoxygenation: Pre-oxygenation with 100% oxygen Induction Type: IV induction Ventilation: Mask ventilation without difficulty Laryngoscope Size: Miller and 2 Grade View: Grade I Tube type: Oral Tube size: 7.5 mm Number of attempts: 1 Airway Equipment and Method: Stylet and Oral airway Placement Confirmation: ETT inserted through vocal cords under direct vision, positive ETCO2 and breath sounds checked- equal and bilateral Secured at: 22 cm Tube secured with: Tape Dental Injury: Teeth and Oropharynx as per pre-operative assessment

## 2024-11-07 NOTE — Transfer of Care (Signed)
 Immediate Anesthesia Transfer of Care Note  Patient: Kathleen Moyer  Procedure(s) Performed: CREATION, BYPASS, ARTERIAL, AORTA TO FEMORAL, BILATERAL, USING GRAFT (Bilateral) LYSIS OF ADHESIONS (Abdomen) INTRA OPERATIVE ARTERIOGRAM (Right: Groin) ENDARTERECTOMY, FEMORAL (Bilateral: Groin)  Patient Location: PACU  Anesthesia Type:General  Level of Consciousness: awake and drowsy  Airway & Oxygen Therapy: Patient Spontanous Breathing and Patient connected to nasal cannula oxygen  Post-op Assessment: Report given to RN, Post -op Vital signs reviewed and stable, and Patient moving all extremities X 4  Post vital signs: Reviewed and stable  Last Vitals:  Vitals Value Taken Time  BP 130/62 11/07/24 13:45  Temp 37.2 C 11/07/24 13:42  Pulse 99 11/07/24 13:48  Resp 17 11/07/24 13:48  SpO2 99 % 11/07/24 13:48  Vitals shown include unfiled device data.  Last Pain:  Vitals:   11/07/24 0627  TempSrc:   PainSc: 10-Worst pain ever      Patients Stated Pain Goal: 2 (11/07/24 9372)  Complications: No notable events documented.
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
## 2024-11-08 ENCOUNTER — Encounter (HOSPITAL_COMMUNITY): Payer: Self-pay | Admitting: Vascular Surgery

## 2024-11-08 DIAGNOSIS — I998 Other disorder of circulatory system: Secondary | ICD-10-CM | POA: Diagnosis not present

## 2024-11-08 DIAGNOSIS — I70221 Atherosclerosis of native arteries of extremities with rest pain, right leg: Secondary | ICD-10-CM | POA: Diagnosis not present

## 2024-11-08 DIAGNOSIS — E785 Hyperlipidemia, unspecified: Secondary | ICD-10-CM

## 2024-11-08 DIAGNOSIS — Z95828 Presence of other vascular implants and grafts: Secondary | ICD-10-CM

## 2024-11-08 DIAGNOSIS — I10 Essential (primary) hypertension: Secondary | ICD-10-CM | POA: Diagnosis not present

## 2024-11-08 DIAGNOSIS — Z9889 Other specified postprocedural states: Secondary | ICD-10-CM

## 2024-11-08 LAB — CBC WITH DIFFERENTIAL/PLATELET
Abs Immature Granulocytes: 0.07 K/uL (ref 0.00–0.07)
Basophils Absolute: 0 K/uL (ref 0.0–0.1)
Basophils Relative: 0 %
Eosinophils Absolute: 0 K/uL (ref 0.0–0.5)
Eosinophils Relative: 0 %
HCT: 31.4 % — ABNORMAL LOW (ref 36.0–46.0)
Hemoglobin: 10.8 g/dL — ABNORMAL LOW (ref 12.0–15.0)
Immature Granulocytes: 1 %
Lymphocytes Relative: 11 %
Lymphs Abs: 1.3 K/uL (ref 0.7–4.0)
MCH: 30.3 pg (ref 26.0–34.0)
MCHC: 34.4 g/dL (ref 30.0–36.0)
MCV: 88.2 fL (ref 80.0–100.0)
Monocytes Absolute: 1 K/uL (ref 0.1–1.0)
Monocytes Relative: 8 %
Neutro Abs: 9.6 K/uL — ABNORMAL HIGH (ref 1.7–7.7)
Neutrophils Relative %: 80 %
Platelets: 135 K/uL — ABNORMAL LOW (ref 150–400)
RBC: 3.56 MIL/uL — ABNORMAL LOW (ref 3.87–5.11)
RDW: 13.7 % (ref 11.5–15.5)
WBC: 11.9 K/uL — ABNORMAL HIGH (ref 4.0–10.5)
nRBC: 0 % (ref 0.0–0.2)

## 2024-11-08 LAB — BASIC METABOLIC PANEL WITH GFR
Anion gap: 9 (ref 5–15)
Anion gap: 7 (ref 5–15)
BUN: 16 mg/dL (ref 8–23)
BUN: 17 mg/dL (ref 8–23)
CO2: 25 mmol/L (ref 22–32)
CO2: 27 mmol/L (ref 22–32)
Calcium: 7.8 mg/dL — ABNORMAL LOW (ref 8.9–10.3)
Calcium: 8.1 mg/dL — ABNORMAL LOW (ref 8.9–10.3)
Chloride: 100 mmol/L (ref 98–111)
Chloride: 99 mmol/L (ref 98–111)
Creatinine, Ser: 0.77 mg/dL (ref 0.44–1.00)
Creatinine, Ser: 0.82 mg/dL (ref 0.44–1.00)
GFR, Estimated: >60 mL/min
GFR, Estimated: >60 mL/min
Glucose, Bld: 135 mg/dL — ABNORMAL HIGH (ref 70–99)
Glucose, Bld: 130 mg/dL — ABNORMAL HIGH (ref 70–99)
Potassium: 3.8 mmol/L (ref 3.5–5.1)
Potassium: 4.1 mmol/L (ref 3.5–5.1)
Sodium: 134 mmol/L — ABNORMAL LOW (ref 135–145)
Sodium: 133 mmol/L — ABNORMAL LOW (ref 135–145)

## 2024-11-08 LAB — CBC
HCT: 35.5 % — ABNORMAL LOW (ref 36.0–46.0)
Hemoglobin: 12.6 g/dL (ref 12.0–15.0)
MCH: 30.8 pg (ref 26.0–34.0)
MCHC: 35.5 g/dL (ref 30.0–36.0)
MCV: 86.8 fL (ref 80.0–100.0)
Platelets: 183 K/uL (ref 150–400)
RBC: 4.09 MIL/uL (ref 3.87–5.11)
RDW: 13.6 % (ref 11.5–15.5)
WBC: 13 K/uL — ABNORMAL HIGH (ref 4.0–10.5)
nRBC: 0 % (ref 0.0–0.2)

## 2024-11-08 LAB — LIPASE, BLOOD: Lipase: 53 U/L — ABNORMAL HIGH (ref 11–51)

## 2024-11-08 LAB — AMYLASE: Amylase: 42 U/L (ref 28–100)

## 2024-11-08 LAB — MAGNESIUM: Magnesium: 2.5 mg/dL — ABNORMAL HIGH (ref 1.7–2.4)

## 2024-11-08 MED ORDER — ORAL CARE MOUTH RINSE: 15.0000 mL | OROMUCOSAL | Status: DC | PRN

## 2024-11-08 MED ORDER — BISACODYL 10 MG RE SUPP
10.0000 mg | Freq: Once | RECTAL | Status: AC
  Administered 2024-11-08: 10 mg via RECTAL
  Filled 2024-11-08: qty 1

## 2024-11-08 MED FILL — Sodium Chloride IV Soln 0.9%: INTRAVENOUS | Qty: 1000 | Status: AC

## 2024-11-08 MED FILL — Sodium Chloride Irrigation Soln 0.9%: Qty: 3000 | Status: AC

## 2024-11-08 MED FILL — Heparin Sodium (Porcine) Inj 1000 Unit/ML: INTRAMUSCULAR | Qty: 30 | Status: AC

## 2024-11-08 NOTE — Evaluation (Signed)
 Physical Therapy Evaluation Patient Details Name: Kathleen Moyer MRN: [id number] DOB: 08/15/1956 Today's Date: 11/08/2024  History of Present Illness  The pt is a 69 yo F presenting 1/5 for aortobifemoral bypass and bilateral common femoral endarterectomy due to severe PAD. PMH includes: tobacco use, HTN, PVD  Clinical Impression  Pt in bed upon arrival of PT, agreeable to evaluation at this time. Prior to admission the pt was completely independent without use of DME, living alone, but with good family support as needed after d/c. The pt currently required modA to complete trunk elevation due to pain from abdominal incision, but was able to stand and manage initial steps in room with minA and single UE support. Anticipate that with continued acute PT, pt will be able to progress functional endurance and be able to return home, would benefit from HHPT at this time given significant decline in activity tolerance, LE power (due to pain in groin and abdominal incisions), and dynamic stability.         If plan is discharge home, recommend the following: A little help with walking and/or transfers;A little help with bathing/dressing/bathroom;Assistance with cooking/housework;Assist for transportation;Help with stairs or ramp for entrance   Can travel by private vehicle        Equipment Recommendations Rolling walker (2 wheels)  Recommendations for Other Services       Functional Status Assessment Patient has had a recent decline in their functional status and demonstrates the ability to make significant improvements in function in a reasonable and predictable amount of time.     Precautions / Restrictions Precautions Precautions: Fall (abdominal) Recall of Precautions/Restrictions: Intact Precaution/Restrictions Comments: a-line LUE, bilateral groin incisions and abdominal incision Restrictions Weight Bearing Restrictions Per Provider Order: No      Mobility  Bed Mobility Overal  bed mobility: Needs Assistance Bed Mobility: Sidelying to Sit   Sidelying to sit: Mod assist       General bed mobility comments: pt able to manage LE, modA at trunk to elevate    Transfers Overall transfer level: Needs assistance Equipment used: 1 person hand held assist Transfers: Sit to/from Stand, Bed to chair/wheelchair/BSC Sit to Stand: Min assist   Step pivot transfers: Min assist       General transfer comment: minA to rise and steady, slight posterior lean    Ambulation/Gait Ambulation/Gait assistance: Min assist Gait Distance (Feet): 4 Feet Assistive device: 1 person hand held assist Gait Pattern/deviations: Step-through pattern, Decreased stride length Gait velocity: decreased     General Gait Details: pt with small steps with minimal clearance, minA to steady but no overt LOB or buckling. pt limited by dizziness despite BP stable     Balance Overall balance assessment: Needs assistance Sitting-balance support: No upper extremity supported Sitting balance-Leahy Scale: Good     Standing balance support: Single extremity supported, During functional activity Standing balance-Leahy Scale: Fair                               Pertinent Vitals/Pain Pain Assessment Pain Assessment: Faces Pain Score: 10-Worst pain ever Faces Pain Scale: Hurts whole lot Pain Location: abdominal incision (intact) Pain Descriptors / Indicators: Discomfort, Grimacing, Guarding Pain Intervention(s): Limited activity within patient's tolerance, Monitored during session, Repositioned    Home Living Family/patient expects to be discharged to:: Private residence Living Arrangements: Alone Available Help at Discharge: Family;Available 24 hours/day (sister can stay) Type of Home: Mobile home Home Access: Stairs  to enter   Entrance Stairs-Number of Steps: 4   Home Layout: One level Home Equipment: Rollator (4 wheels);Cane - single point;BSC/3in1;Shower seat;Grab bars  - tub/shower;Hand held shower head Additional Comments: pt reports lives alone but sister can stay with her    Prior Function Prior Level of Function : Independent/Modified Independent             Mobility Comments: denies falls, no use of DME, reports limited exercise but plays with her cat at home. ADLs Comments: independent, sister plans to drive after surgery     Extremity/Trunk Assessment   Upper Extremity Assessment Upper Extremity Assessment: Defer to OT evaluation    Lower Extremity Assessment Lower Extremity Assessment: Overall WFL for tasks assessed    Cervical / Trunk Assessment Cervical / Trunk Assessment: Other exceptions Cervical / Trunk Exceptions: bilateral groin incisions and abdominal incision  Communication   Communication Communication: No apparent difficulties    Cognition Arousal: Alert Behavior During Therapy: WFL for tasks assessed/performed   PT - Cognitive impairments: No apparent impairments                       PT - Cognition Comments: WFL for session, not formally assessed Following commands: Intact       Cueing Cueing Techniques: Verbal cues     General Comments General comments (skin integrity, edema, etc.): VSS on RA, RN present throguhout for additional line management    Exercises     Assessment/Plan    PT Assessment Patient needs continued PT services  PT Problem List Decreased activity tolerance;Decreased range of motion;Decreased balance;Decreased mobility;Pain       PT Treatment Interventions DME instruction;Gait training;Stair training;Functional mobility training;Therapeutic activities;Therapeutic exercise;Balance training;Patient/family education    PT Goals (Current goals can be found in the Care Plan section)  Acute Rehab PT Goals Patient Stated Goal: to return home PT Goal Formulation: With patient Time For Goal Achievement: 11/22/24 Potential to Achieve Goals: Good    Frequency Min 2X/week         AM-PAC PT 6 Clicks Mobility  Outcome Measure Help needed turning from your back to your side while in a flat bed without using bedrails?: A Little Help needed moving from lying on your back to sitting on the side of a flat bed without using bedrails?: A Lot Help needed moving to and from a bed to a chair (including a wheelchair)?: A Little Help needed standing up from a chair using your arms (e.g., wheelchair or bedside chair)?: A Little Help needed to walk in hospital room?: Total (<20 ft) Help needed climbing 3-5 steps with a railing? : A Lot 6 Click Score: 14    End of Session   Activity Tolerance: Patient tolerated treatment well;Other (comment) (dizziness) Patient left: in chair;with call bell/phone within reach;with nursing/sitter in room Nurse Communication: Mobility status PT Visit Diagnosis: Unsteadiness on feet (R26.81);Muscle weakness (generalized) (M62.81);Pain Pain - part of body:  (abdomen)    Time: 9087-9067 PT Time Calculation (min) (ACUTE ONLY): 20 min   Charges:   PT Evaluation $PT Eval Moderate Complexity: 1 Mod   PT General Charges $$ ACUTE PT VISIT: 1 Visit         Izetta Call, PT, DPT   Acute Rehabilitation Department Office [phone] Secure Chat Communication Preferred  Izetta JULIANNA Call 11/08/2024, 10:29 AM

## 2024-11-08 NOTE — Evaluation (Signed)
 Occupational Therapy Evaluation Patient Details Name: Kathleen Moyer MRN: [id number] DOB: 05/20/1956 Today's Date: 11/08/2024   History of Present Illness   The pt is a 69 yo F presenting 1/5 for aortobifemoral bypass and bilateral common femoral endarterectomy due to severe PAD. PMH includes: tobacco use, HTN, PVD     Clinical Impressions PTA Kathleen Moyer lives alone at home independently. Requires mod A with bed mobility due to abdominal discomfort however able to ambulate >190 ft with CGA @ RW with Max HR 123. Requries min A for LB ADL tasks due to below listed deficits. Anticipate pt will make excellent progress and not require OT follow up after DC. Acute OT will follow to facilitate safe DC  home.      If plan is discharge home, recommend the following:   A little help with walking and/or transfers;A little help with bathing/dressing/bathroom     Functional Status Assessment   Patient has had a recent decline in their functional status and demonstrates the ability to make significant improvements in function in a reasonable and predictable amount of time.     Equipment Recommendations   BSC/3in1;Other (comment) (RW)     Recommendations for Other Services         Precautions/Restrictions   Precautions Precautions: Fall (abdominal) Recall of Precautions/Restrictions: Intact Precaution/Restrictions Comments: bilateral groin incisions and abdominal incision Restrictions Weight Bearing Restrictions Per Provider Order: No     Mobility Bed Mobility Overal bed mobility: Needs Assistance Bed Mobility: Sidelying to Sit, Sit to Sidelying   Sidelying to sit: Mod assist     Sit to sidelying: Mod assist      Transfers Overall transfer level: Needs assistance Equipment used: 1 person hand held assist Transfers: Sit to/from Stand, Bed to chair/wheelchair/BSC Sit to Stand: Min assist     Step pivot transfers: Min assist            Balance Overall balance  assessment: Needs assistance Sitting-balance support: No upper extremity supported Sitting balance-Leahy Scale: Good     Standing balance support: Single extremity supported, During functional activity Standing balance-Leahy Scale: Fair                             ADL either performed or assessed with clinical judgement   ADL Overall ADL's : Needs assistance/impaired Eating/Feeding: NPO   Grooming: Set up;Sitting   Upper Body Bathing: Set up;Sitting   Lower Body Bathing: Minimal assistance;Sit to/from stand   Upper Body Dressing : Set up;Sitting   Lower Body Dressing: Minimal assistance;Sit to/from stand; able to complete figure four positioning   Toilet Transfer: Contact guard assist;Ambulation     Toileting - Clothing Manipulation Details (indicate cue type and reason): foley     Functional mobility during ADLs: Minimal assistance;Rolling walker (2 wheels)       Vision         Perception         Praxis         Pertinent Vitals/Pain Pain Assessment Pain Assessment: Faces Faces Pain Scale: Hurts little more Pain Location: abdominal incision (intact) Pain Descriptors / Indicators: Discomfort, Grimacing, Guarding Pain Intervention(s): Limited activity within patient's tolerance     Extremity/Trunk Assessment Upper Extremity Assessment Upper Extremity Assessment: Overall WFL for tasks assessed   Lower Extremity Assessment Lower Extremity Assessment: Defer to PT evaluation   Cervical / Trunk Assessment Cervical / Trunk Assessment: Other exceptions Cervical / Trunk Exceptions: bilateral groin incisions and  abdominal incision   Communication Communication Communication: No apparent difficulties   Cognition Arousal: Alert Behavior During Therapy: WFL for tasks assessed/performed Cognition: No apparent impairments                               Following commands: Intact       Cueing  General Comments   Cueing Techniques:  Verbal cues  VSS. Max HR 122   Exercises     Shoulder Instructions      Home Living Family/patient expects to be discharged to:: Private residence Living Arrangements: Alone Available Help at Discharge: Family;Available 24 hours/day (sister can stay) Type of Home: Mobile home Home Access: Stairs to enter Entrance Stairs-Number of Steps: 4 Entrance Stairs-Rails: Right;Left;Can reach both Home Layout: One level     Bathroom Shower/Tub: Producer, Television/film/video: Standard Bathroom Accessibility: Yes How Accessible: Accessible via walker Home Equipment: Other (comment) (may be able to get equipment family if needed)   Additional Comments: pt reports lives alone but sister can stay with her      Prior Functioning/Environment Prior Level of Function : Independent/Modified Independent             Mobility Comments: denies falls, no use of DME, reports limited exercise but plays with her cat at home. ADLs Comments: independent, sister plans to drive after surgery    OT Problem List: Decreased strength;Decreased range of motion;Decreased activity tolerance;Impaired balance (sitting and/or standing);Decreased safety awareness;Decreased knowledge of use of DME or AE;Decreased knowledge of precautions;Cardiopulmonary status limiting activity;Impaired sensation;Pain   OT Treatment/Interventions: Self-care/ADL training;Therapeutic exercise;DME and/or AE instruction;Therapeutic activities;Patient/family education;Balance training      OT Goals(Current goals can be found in the care plan section)   Acute Rehab OT Goals Patient Stated Goal: home OT Goal Formulation: With patient Time For Goal Achievement: 11/22/24 Potential to Achieve Goals: Good   OT Frequency:  Min 2X/week    Co-evaluation              AM-PAC OT 6 Clicks Daily Activity     Outcome Measure Help from another person eating meals?: Total (NPO) Help from another person taking care of personal  grooming?: A Little Help from another person toileting, which includes using toliet, bedpan, or urinal?: A Little Help from another person bathing (including washing, rinsing, drying)?: A Little Help from another person to put on and taking off regular upper body clothing?: A Little Help from another person to put on and taking off regular lower body clothing?: A Little 6 Click Score: 16   End of Session Equipment Utilized During Treatment: Gait belt;Rolling walker (2 wheels) Nurse Communication: Mobility status  Activity Tolerance: Patient tolerated treatment well Patient left: in bed;with call bell/phone within reach;with bed alarm set  OT Visit Diagnosis: Unsteadiness on feet (R26.81);Other abnormalities of gait and mobility (R26.89);Muscle weakness (generalized) (M62.81);Pain Pain - Right/Left: Right Pain - part of body: Ankle and joints of foot                Time: 8356-8286 OT Time Calculation (min): 30 min Charges:  OT General Charges $OT Visit: 1 Visit OT Evaluation $OT Eval Moderate Complexity: 1 Mod OT Treatments $Self Care/Home Management : 8-22 mins  Kreg Sink, OT/L   Acute OT Clinical Specialist Acute Rehabilitation Services Pager [phone] Office [phone]   [facility] 11/08/2024, 5:22 PM

## 2024-11-08 NOTE — Progress Notes (Signed)
 " Aortic Surgery Progress Note    11/08/2024 7:50 AM 1 Day Post-Op  Subjective:  says she feels good this morning and that she was actually able to sleep bc the pain in her right foot has resolved.  She denies passing flatus.    Afebrile HR 80's-90's NSR 100's-120's systolic 95% 2LO2NC  Gtts:  cleviprex   Vitals:   11/08/24 0645 11/08/24 0700  BP:  94/75  Pulse: 92 89  Resp: 16 15  Temp:    SpO2: 95% 96%    Physical Exam: Cardiac:  regular Lungs:  non labored on 2LO2NC Abdomen:  soft; - flatus Incisions:  laparotomy and bilateral groin incisions are clean and intact. No hematoma present Extremities:  palpable left PT pulse and brisk multiphasic DP doppler signal.  Brisk right DP/PT doppler signals.  Bilateral calves are soft without evidence of compartment syndrome.   General:  no distress; resting comfortably.  CBC    Component Value Date/Time   WBC 13.0 (H) 11/08/2024 0457   RBC 4.09 11/08/2024 0457   HGB 12.6 11/08/2024 0457   HCT 35.5 (L) 11/08/2024 0457   PLT 183 11/08/2024 0457   MCV 86.8 11/08/2024 0457   MCH 30.8 11/08/2024 0457   MCHC 35.5 11/08/2024 0457   RDW 13.6 11/08/2024 0457    BMET    Component Value Date/Time   NA 134 (L) 11/08/2024 0457   NA 137 10/13/2019 1112   K 3.8 11/08/2024 0457   CL 100 11/08/2024 0457   CO2 25 11/08/2024 0457   GLUCOSE 135 (H) 11/08/2024 0457   BUN 16 11/08/2024 0457   BUN 19 10/13/2019 1112   CREATININE 0.77 11/08/2024 0457   CALCIUM 7.8 (L) 11/08/2024 0457   GFRNONAA >60 11/08/2024 0457   GFRAA 87 10/13/2019 1112    INR    Component Value Date/Time   INR 1.0 11/07/2024 2055     Intake/Output Summary (Last 24 hours) at 11/08/2024 0750 Last data filed at 11/08/2024 0700 Gross per 24 hour  Intake 7363.59 ml  Output 2960 ml  Net 4403.59 ml    UOP:  1760cc/24hr   Assessment/Plan:  69 y.o. female is s/p  ABF bypass graft, ligation of IMA, bilateral CFA endarterectomy 11/08/2024 by Dr. Lanis 1 Day  Post-Op  -Vascular:  palpable left PT pulse and brisk multiphasic DP doppler signal.  Brisk right DP/PT doppler signals.  Bilateral calves are soft without evidence of compartment syndrome.   -Cardiac:  regular and hemodynamically stable.  On Cleviprex  for pressure.   -Pulmonary:  extubated on 2LO2NC.  Continue with IS.  Out of bed today.   -Neuro:  in tact -Renal:  good UOP and renal function is normal this morning.   -GI:  abdomen is soft; -flatus.  Will give dulcolax suppository this am to stimulate bowels.  Continue npo until bowel function returns.  -Incisions:  all incisions look good and in tact. -Heme/ID:  acute surgical blood loss anemia-hgb 12.6 this am and received 2 units PRBCs yesterday.  Mild leukocytosis related to periop period.   -General:  no distress  -oob with PT/OT today.  -dulcolax supp to stimulate bowels.   -most likely will remain in ICU today -IS every hour while awake.   Kathleen Apt, PA-C [facility] [phone] 11/08/2024 7:50 AM   VASCULAR STAFF ADDENDUM: I have independently interviewed and examined the patient. I agree with the above.  Overall Kathleen Moyer appears to be doing well.  Labs unchanged from yesterday No bleeding, renal  function unchanged Mild abdominal pain, diffuse, and expected. Palpable left-sided PT, multiphasic right-sided PT  Plan will be up out of bed today Can take out the central line Continue A-line until Cleviprex  is weaned Continue Foley, continue NG tube Continue n.p.o. status  Likely floor tomorrow pending continued clinical improvement.   Kathleen FORBES Rim MD [facility] Phone Number: [phone] 11/08/2024 7:50 AM   "
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
## 2024-11-08 NOTE — Progress Notes (Signed)
" °  Aortic Surgery Progress Note    11/08/2024 6:48 AM 1 Day Post-Op  Subjective:  says she feels good this morning and that she was actually able to sleep bc the pain in her right foot has resolved.  She denies passing flatus.    Afebrile HR 80's-90's NSR 100's-120's systolic 95% 2LO2NC  Gtts:  cleviprex   Vitals:   11/08/24 0545 11/08/24 0600  BP:  (!) 124/58  Pulse: 93 87  Resp: 20 (!) 21  Temp:    SpO2: 100% 99%    Physical Exam: Cardiac:  regular Lungs:  non labored on 2LO2NC Abdomen:  soft; - flatus Incisions:  laparotomy and bilateral groin incisions are clean and intact. No hematoma present Extremities:  palpable left PT pulse and brisk multiphasic DP doppler signal.  Brisk right DP/PT doppler signals.  Bilateral calves are soft without evidence of compartment syndrome.   General:  no distress; resting comfortably.  CBC    Component Value Date/Time   WBC 13.0 (H) 11/08/2024 0457   RBC 4.09 11/08/2024 0457   HGB 12.6 11/08/2024 0457   HCT 35.5 (L) 11/08/2024 0457   PLT 183 11/08/2024 0457   MCV 86.8 11/08/2024 0457   MCH 30.8 11/08/2024 0457   MCHC 35.5 11/08/2024 0457   RDW 13.6 11/08/2024 0457    BMET    Component Value Date/Time   NA 135 11/07/2024 2055   NA 137 10/13/2019 1112   K 4.4 11/07/2024 2055   CL 100 11/07/2024 2055   CO2 24 11/07/2024 2055   GLUCOSE 164 (H) 11/07/2024 2055   BUN 19 11/07/2024 2055   BUN 19 10/13/2019 1112   CREATININE 0.89 11/07/2024 2055   CALCIUM 8.2 (L) 11/07/2024 2055   GFRNONAA >60 11/07/2024 2055   GFRAA 87 10/13/2019 1112    INR    Component Value Date/Time   INR 1.0 11/07/2024 2055     Intake/Output Summary (Last 24 hours) at 11/08/2024 0648 Last data filed at 11/08/2024 0600 Gross per 24 hour  Intake 7261.04 ml  Output 2835 ml  Net 4426.04 ml    UOP:  1760cc/24hr   Assessment/Plan:  69 y.o. female is s/p  ABF bypass graft, ligation of IMA, bilateral CFA endarterectomy 11/08/2024 by Dr. Lanis 1  Day Post-Op  -Vascular:  palpable left PT pulse and brisk multiphasic DP doppler signal.  Brisk right DP/PT doppler signals.  Bilateral calves are soft without evidence of compartment syndrome.   -Cardiac:  regular and hemodynamically stable.  On Cleviprex  for pressure.   -Pulmonary:  extubated on 2LO2NC.  Continue with IS.  Out of bed today.   -Neuro:  in tact -Renal:  good UOP and renal function is normal this morning.   -GI:  abdomen is soft; -flatus.  Will give dulcolax suppository this am to stimulate bowels.  Continue npo until bowel function returns.  -Incisions:  all incisions look good and in tact. -Heme/ID:  acute surgical blood loss anemia-hgb 12.6 this am and received 2 units PRBCs yesterday.  Mild leukocytosis related to periop period.   -General:  no distress  -oob with PT/OT today.  -dulcolax supp to stimulate bowels.   -most likely will remain in ICU today -IS every hour while awake.   Lucie Apt, PA-C [facility] [phone] 11/08/2024 6:48 AM  "

## 2024-11-09 DIAGNOSIS — Z9889 Other specified postprocedural states: Secondary | ICD-10-CM

## 2024-11-09 DIAGNOSIS — I739 Peripheral vascular disease, unspecified: Secondary | ICD-10-CM

## 2024-11-09 DIAGNOSIS — K567 Ileus, unspecified: Secondary | ICD-10-CM

## 2024-11-09 DIAGNOSIS — I70321 Atherosclerosis of unspecified type of bypass graft(s) of the extremities with rest pain, right leg: Secondary | ICD-10-CM

## 2024-11-09 LAB — CBC
HCT: 30.4 % — ABNORMAL LOW (ref 36.0–46.0)
Hemoglobin: 10.4 g/dL — ABNORMAL LOW (ref 12.0–15.0)
MCH: 30.3 pg (ref 26.0–34.0)
MCHC: 34.2 g/dL (ref 30.0–36.0)
MCV: 88.6 fL (ref 80.0–100.0)
Platelets: 121 K/uL — ABNORMAL LOW (ref 150–400)
RBC: 3.43 MIL/uL — ABNORMAL LOW (ref 3.87–5.11)
RDW: 13.5 % (ref 11.5–15.5)
WBC: 10.5 K/uL (ref 4.0–10.5)
nRBC: 0 % (ref 0.0–0.2)

## 2024-11-09 LAB — BASIC METABOLIC PANEL WITH GFR
Anion gap: 9 (ref 5–15)
BUN: 12 mg/dL (ref 8–23)
CO2: 25 mmol/L (ref 22–32)
Calcium: 8.2 mg/dL — ABNORMAL LOW (ref 8.9–10.3)
Chloride: 102 mmol/L (ref 98–111)
Creatinine, Ser: 0.74 mg/dL (ref 0.44–1.00)
GFR, Estimated: >60 mL/min
Glucose, Bld: 105 mg/dL — ABNORMAL HIGH (ref 70–99)
Potassium: 3.7 mmol/L (ref 3.5–5.1)
Sodium: 136 mmol/L (ref 135–145)

## 2024-11-09 LAB — MAGNESIUM: Magnesium: 1.6 mg/dL — ABNORMAL LOW (ref 1.7–2.4)

## 2024-11-09 MED ORDER — IPRATROPIUM-ALBUTEROL 0.5-2.5 (3) MG/3ML IN SOLN: 3.0000 mL | Freq: Four times a day (QID) | RESPIRATORY_TRACT | Status: DC | PRN

## 2024-11-09 MED ORDER — SENNOSIDES-DOCUSATE SODIUM 8.6-50 MG PO TABS
2.0000 | ORAL_TABLET | Freq: Two times a day (BID) | ORAL | Status: DC
  Administered 2024-11-09: 2 via NASOGASTRIC
  Filled 2024-11-09: qty 2

## 2024-11-09 MED ORDER — IRBESARTAN 300 MG PO TABS
300.0000 mg | ORAL_TABLET | Freq: Every day | ORAL | Status: DC
  Administered 2024-11-09: 300 mg via NASOGASTRIC
  Filled 2024-11-09: qty 1

## 2024-11-09 MED ORDER — LABETALOL HCL 5 MG/ML IV SOLN
10.0000 mg | INTRAVENOUS | Status: DC | PRN
  Administered 2024-11-09: 10 mg via INTRAVENOUS
  Filled 2024-11-09: qty 4

## 2024-11-09 MED ORDER — HYDROCHLOROTHIAZIDE 12.5 MG PO TABS
12.5000 mg | ORAL_TABLET | Freq: Every day | ORAL | Status: DC
  Administered 2024-11-10 – 2024-11-12 (×3): 12.5 mg via ORAL
  Filled 2024-11-09 (×3): qty 1

## 2024-11-09 MED ORDER — BISACODYL 10 MG RE SUPP: 10.0000 mg | Freq: Every day | RECTAL | Status: DC | PRN

## 2024-11-09 MED ORDER — POTASSIUM CHLORIDE 20 MEQ PO PACK
40.0000 meq | PACK | Freq: Once | ORAL | Status: AC
  Administered 2024-11-09: 40 meq
  Filled 2024-11-09: qty 2

## 2024-11-09 MED ORDER — POLYETHYLENE GLYCOL 3350 17 G PO PACK
17.0000 g | PACK | Freq: Every day | ORAL | Status: DC
  Administered 2024-11-09: 17 g
  Filled 2024-11-09: qty 1

## 2024-11-09 MED ORDER — IRBESARTAN 300 MG PO TABS
300.0000 mg | ORAL_TABLET | Freq: Every day | ORAL | Status: DC
  Administered 2024-11-10 – 2024-11-12 (×3): 300 mg via ORAL
  Filled 2024-11-09 (×3): qty 1

## 2024-11-09 MED ORDER — POLYETHYLENE GLYCOL 3350 17 G PO PACK
17.0000 g | PACK | Freq: Every day | ORAL | Status: DC
  Administered 2024-11-10: 17 g via ORAL
  Filled 2024-11-09 (×2): qty 1

## 2024-11-09 MED ORDER — FAMOTIDINE 20 MG PO TABS
20.0000 mg | ORAL_TABLET | Freq: Two times a day (BID) | ORAL | Status: DC
  Administered 2024-11-10 – 2024-11-12 (×5): 20 mg via ORAL
  Filled 2024-11-09 (×5): qty 1

## 2024-11-09 MED ORDER — MAGNESIUM SULFATE 4 GM/100ML IV SOLN
4.0000 g | Freq: Once | INTRAVENOUS | Status: AC
  Administered 2024-11-09: 4 g via INTRAVENOUS
  Filled 2024-11-09: qty 100

## 2024-11-09 MED ORDER — SENNOSIDES-DOCUSATE SODIUM 8.6-50 MG PO TABS
2.0000 | ORAL_TABLET | Freq: Two times a day (BID) | ORAL | Status: DC
  Administered 2024-11-10: 2 via ORAL
  Filled 2024-11-09: qty 2

## 2024-11-09 NOTE — Progress Notes (Signed)
 Nursing reporting HTN Goals previously keeping <140. Antihypertensives started yesterday. Rising to 150s w/ no sig response to labetolol PRN Plan Will add back cleviprex  for now to keep tight control.  Increasing her avapro  to 300mg /d should help us  to get her off the cleviprex 

## 2024-11-09 NOTE — Discharge Instructions (Signed)
 [facility]  Discharge Instructions   Open Aortic Surgery  Please refer to the following instructions for your post-procedure care. Your surgeon or Physician Assistant will discuss any changes with you.  Activity  Avoid lifting more than eight pounds (a gallon of milk) until after your first post-operative visit. You are encouraged to walk as much as you can. You can slowly return to normal activities but must avoid strenuous activity and heavy lifting until your doctor tells you it's okay. Heavy lifting can hurt the incision and cause a hernia. Avoid activities such as vacuuming or swinging a golf club. It is normal to feel tired for several weeks after your surgery. Do not drive until your doctor gives the okay and you are no longer taking prescription pain medications. It is also normal to have difficulty with sleep habits, eating and bowl movements after surgery. These will go away with time.  Bathing/Showering  Shower daily after you go home. Do not soak in a bathtub, hot tub, or swim until the incision heals.  Incision Care  Shower every day. Clean your incision with mild soap and water. Pat the area dry with a clean towel. You do not need a bandage unless otherwise instructed. Do not apply any ointments or creams to your incision. You may have skin glue on your incision. Do not peel it off. It will come off on its own in about one week. If you have staples or sutures along your incision, they will be removed at your post op appointment.  If you have groin incisions, wash the groin wounds with soap and water daily and pat dry. (No tub bath-only shower)  Then put a dry gauze or washcloth in the groin to keep this area dry to help prevent wound infection.  Do this daily and as needed.  Do not use Vaseline or neosporin on your incisions.  Only use soap and water on your incisions and then protect and keep dry.  Diet  Resume your normal diet. There are no  special food restriction following this procedure. A low fat/low cholesterol diet is recommended for all patients with vascular disease. After your aortic surgery, it's normal to feel full faster than usual and to not feel as hungry as you normally would. You will probably lose weight initially following your surgery. It's best to eat small, frequent meals over the course of the day. Call the office if you find that you are unable to eat even small meals.   In order to heal from your surgery, it is CRITICAL to get adequate nutrition. Your body requires vitamins, minerals, and protein. Vegetables are the best source of vitamins and minerals. If you have pain, you may take over-the-counter pain reliever such as acetaminophen (Tylenol). If you were prescribed a stronger pain medication, please be aware these medication can cause nausea and constipation. Prevent nausea by taking the medication with a snack or meal. Avoid constipation by drinking plenty of fluids and eating foods with a high amount of fiber, such as fruits, vegetables and grains. Take 100mg of the over-the-counter stool softener Colace twice a day as needed to help with constipation. A laxative, such as Milk of Magnesia, may be recommended for you at this time. Do not take a laxative unless your surgeon or P.A. tells you it's OK.  Do not take Tylenol if you are taking stronger pain medications (such as Percocet).  Follow Up  Our office will schedule a follow up   appointment 2-3 weeks after discharge.  Please call us immediately for any of the following conditions    .     Severe or worsening pain in your legs or feet or in your abdomen back or chest. Increased pain, redness drainage (pus) from your incision site. Increased abdominal pain, bloating, nausea, vomiting, or persistent diarrhea. Fever of 101 degrees or higher. Swelling in your leg (s).  Reduce your risk of vascular disease  Stop smoking. If you would like help, call  [facility] at 1-800-QUIT-NOW (1-800-784-8669) or  at 336-586-4000. Manage your cholesterol Maintain a desired weight Control your diabetes Keep your blood pressure down  If you have any questions please call the office at [phone].   

## 2024-11-09 NOTE — Progress Notes (Signed)
 Patient arrived in the unit, V/S obtained, CCMD notified, all needs met, call bell in reach.   11/09/24 1316  Vitals  Temp 98.3 F (36.8 C)  Temp Source Oral  BP 135/61  MAP (mmHg) 83  BP Location Right Arm  BP Method Automatic  Patient Position (if appropriate) Lying  Pulse Rate 93  Pulse Rate Source Monitor  ECG Heart Rate 93  Resp 19  MEWS COLOR  MEWS Score Color Green  Oxygen Therapy  SpO2 95 %  O2 Device Room Air  Pain Assessment  Pain Scale 0-10  Pain Score 0  MEWS Score  MEWS Temp 0  MEWS Systolic 0  MEWS Pulse 0  MEWS RR 0  MEWS LOC 0  MEWS Score 0

## 2024-11-09 NOTE — Progress Notes (Signed)
 "  NAME:  Kathleen Moyer, MRN:  [id number], DOB:  [birth year], LOS: 2 ADMISSION DATE:  11/07/2024,  CHIEF COMPLAINT: Peripheral vascular disease  History of Present Illness:  69 year old woman with peripheral artery disease and history of smoking. She has a ischemic right foot and underwent aortofemoral bypass. Intraoperative blood loss is 1.6 L. She got 700 of Cell Saver. Patient is brought to ICU for closer monitoring. She is on aspirin . For hypertension she is on hydrochlorothiazide  and irbesartan  which are her home medications. Cleviprex  started to keep SBP below 140. In the ICU patient denies any significant pain. Unless the right lower extremity is touched when she complains of pain. She is alert and oriented. Denies any chest pain nausea vomiting.   Pertinent  Medical History   Past Medical History:  Diagnosis Date   Elevated fasting glucose    Hypercholesteremia    Hypertension    Peripheral vascular disease    Significant Hospital Events: Including procedures, antibiotic start and stop dates in addition to other pertinent events   11/07/2024-right femoropopliteal bypass.  Interim History / Subjective:  Back on Cleviprex  overnight now off with increased ARB. No specific complaints this morning. Not passing gas. Ambulating hallway.  Objective    Blood pressure (!) 144/60, pulse 98, temperature 99.6 F (37.6 C), temperature source Oral, resp. rate 18, height 5' 2 (1.575 m), weight 53.1 kg, SpO2 97%.        Intake/Output Summary (Last 24 hours) at 11/09/2024 1311 Last data filed at 11/09/2024 1100 Gross per 24 hour  Intake 2446.76 ml  Output 1810 ml  Net 636.76 ml   Filed Weights   11/07/24 0609  Weight: 53.1 kg    Examination: General: chronically ill appearing female, resting in bed comfortably  HEENT: [place]/AT, sclera anicteric Neuro: alert and responsive, moves all extremities Chest: breathing comfortably on room air, clear-diminished bilaterally  Heart: regular rate  and rhythm, normal S1 and S2, no m/r/g Abdomen: soft, non-tender, non-distended, +BS Vascular: bilateral lower extremities warm to touch, bilateral calves soft   Resolved problem list   Assessment and Plan   Peripheral vascular disease with ischemic right lower extremity status post Aorto-femoral bypass graft, Ligation of IMA and Bilateral CFA endarterectomy Ileus History of hypertension Hyperlipidemia H/o Tobacco smoking - Primary management per vascular surgery - NGT output low, agree with placing to gravity and considering removal this afternoon  - Continue home hydrochlorothiazide  and increased formulary alternative ARB - Counseled smoking cessation  - Encourage bronchopulmonary hygiene, IS. PRN Duo-nebs - Increase bowel regimen  Will transfer to 4E today, PCCM will sign off.   Labs   CBC: Recent Labs  Lab 11/07/24 1350 11/07/24 2055 11/08/24 0457 11/08/24 2014 11/09/24 0212  WBC 14.2* 13.8* 13.0* 11.9* 10.5  NEUTROABS  --   --   --  9.6*  --   HGB 13.2 13.3 12.6 10.8* 10.4*  HCT 38.4 38.5 35.5* 31.4* 30.4*  MCV 88.9 86.9 86.8 88.2 88.6  PLT 181 187 183 135* 121*    Basic Metabolic Panel: Recent Labs  Lab 11/07/24 1138 11/07/24 1350 11/07/24 2055 11/08/24 0457 11/08/24 1415 11/09/24 0212  NA 138  --  135 134* 133* 136  K 4.0  --  4.4 3.8 4.1 3.7  CL  --   --  100 100 99 102  CO2  --   --  24 25 27 25   GLUCOSE  --   --  164* 135* 130* 105*  BUN  --   --  19 16 17 12   CREATININE  --   --  0.89 0.77 0.82 0.74  CALCIUM  --   --  8.2* 7.8* 8.1* 8.2*  MG  --  1.3* 1.2* 2.5*  --  1.6*   GFR: Estimated Creatinine Clearance: 51.8 mL/min (by C-G formula based on SCr of 0.74 mg/dL). Recent Labs  Lab 11/07/24 2055 11/08/24 0457 11/08/24 2014 11/09/24 0212  WBC 13.8* 13.0* 11.9* 10.5    Liver Function Tests: Recent Labs  Lab 11/07/24 2055  AST 19  ALT 9  ALKPHOS 27*  BILITOT 0.4  PROT 5.4*  ALBUMIN  3.8   Recent Labs  Lab 11/08/24 0457  LIPASE  53*  AMYLASE 42   No results for input(s): AMMONIA in the last 168 hours.  ABG    Component Value Date/Time   PHART 7.34 (L) 11/07/2024 1350   PCO2ART 46 11/07/2024 1350   PO2ART 110 (H) 11/07/2024 1350   HCO3 24.8 11/07/2024 1350   TCO2 22 11/07/2024 1138   ACIDBASEDEF 1.3 11/07/2024 1350   O2SAT 99.8 11/07/2024 1350     Coagulation Profile: Recent Labs  Lab 11/07/24 2055  INR 1.0    Cardiac Enzymes: No results for input(s): CKTOTAL, CKMB, CKMBINDEX, TROPONINI in the last 168 hours.  HbA1C: Hgb A1c MFr Bld  Date/Time Value Ref Range Status  10/10/2024 03:42 PM 5.8 (H) 4.8 - 5.6 % Final    Comment:             Prediabetes: 5.7 - 6.4          Diabetes: >6.4          Glycemic control for adults with diabetes: <7.0     CBG: No results for input(s): GLUCAP in the last 168 hours.      Rexene LOISE Blush, PA-C 11/09/2024 1:11 PM [facility]  For contact information, see Amion. If no response to pager, please call PCCM 2H APP. After hours, 7PM- 7AM, please call on call APP for 2H.        "

## 2024-11-09 NOTE — Progress Notes (Addendum)
 " Aortic Surgery Progress Note    11/09/2024 6:46 AM 2 Days Post-Op  Subjective:  says she feels better today than yesterday.  She is not passing any flatus.  She states she was given something in her NGT last evening to help with her bowels.  She is also receiving other meds via NGT.  She denies any nausea or abdominal pain.  States her feet continue to feel much better and she is able to sleep.  She worked with PT yesterday.    Tm 99 HR 90's-100's 120's-140's systolic 92% 2LO2NC  Gtts:  cleviprex  was restarted but has been weaned off.   Vitals:   11/09/24 0545 11/09/24 0600  BP: (!) 135/53 (!) 124/54  Pulse: 97 92  Resp: (!) 22 (!) 22  Temp:  99 F (37.2 C)  SpO2: 94% 94%    Physical Exam: Cardiac:  regular Lungs:  non labored Abdomen:  soft, NT; -flatus Incisions:  laparotomy and bilateral groin incisions look good.  Extremities:  bilateral feet are warm and well perfused.  The left PT is palpable.  Bilateral calves remain soft.   General:  no distress sitting up in chair.   CBC    Component Value Date/Time   WBC 10.5 11/09/2024 0212   RBC 3.43 (L) 11/09/2024 0212   HGB 10.4 (L) 11/09/2024 0212   HCT 30.4 (L) 11/09/2024 0212   PLT 121 (L) 11/09/2024 0212   MCV 88.6 11/09/2024 0212   MCH 30.3 11/09/2024 0212   MCHC 34.2 11/09/2024 0212   RDW 13.5 11/09/2024 0212   LYMPHSABS 1.3 11/08/2024 2014   MONOABS 1.0 11/08/2024 2014   EOSABS 0.0 11/08/2024 2014   BASOSABS 0.0 11/08/2024 2014    BMET    Component Value Date/Time   NA 136 11/09/2024 0212   NA 137 10/13/2019 1112   K 3.7 11/09/2024 0212   CL 102 11/09/2024 0212   CO2 25 11/09/2024 0212   GLUCOSE 105 (H) 11/09/2024 0212   BUN 12 11/09/2024 0212   BUN 19 10/13/2019 1112   CREATININE 0.74 11/09/2024 0212   CALCIUM 8.2 (L) 11/09/2024 0212   GFRNONAA >60 11/09/2024 0212   GFRAA 87 10/13/2019 1112    INR    Component Value Date/Time   INR 1.0 11/07/2024 2055     Intake/Output Summary (Last 24  hours) at 11/09/2024 0646 Last data filed at 11/09/2024 0630 Gross per 24 hour  Intake 2279.38 ml  Output 1785 ml  Net 494.38 ml     Assessment/Plan:  69 y.o. female is s/p  ABF bypass graft, ligation of IMA, bilateral CFA endarterectomy 11/08/2024 by Dr. Lanis  2 Days Post-Op  -Vascular:  feet subjectively much improved.  Bilateral feet are warm and well perfused.  No evidence of compartment syndrome -Cardiac:  hemodynamically stable.  She did require Cleviprex  overnight.  She was started on her ARB via NGT and Cleviprex  was weaned off.   -Pulmonary:  non labored breathing on [place].  She continues to use her IS.   -Neuro:  in tact -Renal:  renal function remains normal with UOP 1.6L.  most likely can discontinue foley today.  -GI:  NGT with 100cc out 1st shift.  She is not passing any flatus despite dulcolax supp and Senna via NGT.  Abdomen is soft and non tender.  Of note, she also got Oxycodone  10mg  per NGT as well.  Continue to mobilize out of bed to help gut function return -Incisions:  all incisions look good.   -  Heme/ID:  leukocytosis improved.  Acute blood loss anemia is stable from yesterday 10.4 today down from 10.8.  Her hgb yesterday morning was 12.6-most likely dilutional.   Thrombocytopenia-platelet count 121k down from 135k last evening.  Continue to monitor.  -General:  looks great this morning up in chair.    -PT recommending HH PT and RW.  Face to face, dme order and TOC consult ordered.  -continue npo until gut function returns.  She has received po meds via ngt and tolerating without nausea or vomiting.  -most likely can discontinue foley and transfer to 4E today if ok with Dr. Lanis.  -continue to monitor CBC. -continue baby asa   Samantha Rhyne, PA-C [facility] [phone] 11/09/2024 6:46 AM  VASCULAR STAFF ADDENDUM: I have independently interviewed and examined the patient. I agree with the above.  Overall, the patient is doing well status  post ABF, progressing appropriately. No bowel sounds, no bowel function.  Will pull NG back to 10 cm and put to gravity, possibly pull later today as output has been very low. Patient okay to go to the floor PT OT Foley out  Continue daily labs   Fonda FORBES Lanis MD [facility] Phone Number: [phone] 11/09/2024 8:22 AM    "

## 2024-11-10 LAB — BASIC METABOLIC PANEL WITH GFR
Anion gap: 9 (ref 5–15)
BUN: 10 mg/dL (ref 8–23)
CO2: 26 mmol/L (ref 22–32)
Calcium: 8.6 mg/dL — ABNORMAL LOW (ref 8.9–10.3)
Chloride: 99 mmol/L (ref 98–111)
Creatinine, Ser: 0.59 mg/dL (ref 0.44–1.00)
GFR, Estimated: >60 mL/min
Glucose, Bld: 88 mg/dL (ref 70–99)
Potassium: 3.8 mmol/L (ref 3.5–5.1)
Sodium: 134 mmol/L — ABNORMAL LOW (ref 135–145)

## 2024-11-10 LAB — CBC
HCT: 28.7 % — ABNORMAL LOW (ref 36.0–46.0)
Hemoglobin: 10 g/dL — ABNORMAL LOW (ref 12.0–15.0)
MCH: 30.9 pg (ref 26.0–34.0)
MCHC: 34.8 g/dL (ref 30.0–36.0)
MCV: 88.6 fL (ref 80.0–100.0)
Platelets: 136 K/uL — ABNORMAL LOW (ref 150–400)
RBC: 3.24 MIL/uL — ABNORMAL LOW (ref 3.87–5.11)
RDW: 13 % (ref 11.5–15.5)
WBC: 10.2 K/uL (ref 4.0–10.5)
nRBC: 0 % (ref 0.0–0.2)

## 2024-11-10 LAB — MAGNESIUM: Magnesium: 1.7 mg/dL (ref 1.7–2.4)

## 2024-11-10 MED ORDER — OXYCODONE HCL 5 MG PO TABS: 5.0000 mg | ORAL_TABLET | ORAL | Status: DC | PRN

## 2024-11-10 MED ORDER — ENSURE PLUS HIGH PROTEIN PO LIQD
237.0000 mL | Freq: Two times a day (BID) | ORAL | Status: DC
  Administered 2024-11-11 – 2024-11-12 (×3): 237 mL via ORAL

## 2024-11-10 MED ORDER — ROSUVASTATIN CALCIUM 20 MG PO TABS
20.0000 mg | ORAL_TABLET | Freq: Every day | ORAL | Status: DC
  Administered 2024-11-10 – 2024-11-12 (×3): 20 mg via ORAL
  Filled 2024-11-10 (×3): qty 1

## 2024-11-10 NOTE — Progress Notes (Signed)
" °  Aortic Surgery Progress Note    11/10/2024 8:22 AM 3 Days Post-Op  Subjective:  Passing gas  Tm 99 HR 90's-100's 120's-140's systolic 92% 2LO2NC   Vitals:   11/09/24 2317 11/10/24 0302  BP: (!) 142/64 (!) 160/80  Pulse: 89 89  Resp: 19 20  Temp: 98.2 F (36.8 C) 97.8 F (36.6 C)  SpO2: 95% 98%    Physical Exam: Cardiac:  regular Lungs:  non labored Abdomen:  soft, NT; -flatus Incisions:  laparotomy and bilateral groin incisions look good.  Extremities:  bilateral feet are warm and well perfused.  The left PT is palpable.  Bilateral calves remain soft.   General:  no distress sitting up in chair.   CBC    Component Value Date/Time   WBC 10.2 11/10/2024 0419   RBC 3.24 (L) 11/10/2024 0419   HGB 10.0 (L) 11/10/2024 0419   HCT 28.7 (L) 11/10/2024 0419   PLT 136 (L) 11/10/2024 0419   MCV 88.6 11/10/2024 0419   MCH 30.9 11/10/2024 0419   MCHC 34.8 11/10/2024 0419   RDW 13.0 11/10/2024 0419   LYMPHSABS 1.3 11/08/2024 2014   MONOABS 1.0 11/08/2024 2014   EOSABS 0.0 11/08/2024 2014   BASOSABS 0.0 11/08/2024 2014    BMET    Component Value Date/Time   NA 134 (L) 11/10/2024 0419   NA 137 10/13/2019 1112   K 3.8 11/10/2024 0419   CL 99 11/10/2024 0419   CO2 26 11/10/2024 0419   GLUCOSE 88 11/10/2024 0419   BUN 10 11/10/2024 0419   BUN 19 10/13/2019 1112   CREATININE 0.59 11/10/2024 0419   CALCIUM  8.6 (L) 11/10/2024 0419   GFRNONAA >60 11/10/2024 0419   GFRAA 87 10/13/2019 1112    INR    Component Value Date/Time   INR 1.0 11/07/2024 2055     Intake/Output Summary (Last 24 hours) at 11/10/2024 9177 Last data filed at 11/09/2024 1529 Gross per 24 hour  Intake 779.39 ml  Output 180 ml  Net 599.39 ml     Assessment/Plan:  69 y.o. female is s/p  ABF bypass graft, ligation of IMA, bilateral CFA endarterectomy 11/08/2024 by Dr. Lanis  3 Days Post-Op  Happy with how she is doing Clear liquid diet today, continue aggressive bowel regimen Up out of bed  as tolerated PT OT nursing If she tolerates clears, will likely transition to food tomorrow.  Would like bowel movement before discharge home.  Discharge likely over the weekend   Fonda FORBES Lanis MD [facility] Phone Number: [phone] 11/10/2024 8:22 AM    "

## 2024-11-10 NOTE — Progress Notes (Signed)
" °  Progress Note    11/10/2024 8:22 AM 3 Days Post-Op  Subjective: No complaints this morning.  Passing gas last night.  No bowel movement yet.  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 Abdomen: Soft nontender Neurologic: A&O  CBC    Component Value Date/Time   WBC 10.2 11/10/2024 0419   RBC 3.24 (L) 11/10/2024 0419   HGB 10.0 (L) 11/10/2024 0419   HCT 28.7 (L) 11/10/2024 0419   PLT 136 (L) 11/10/2024 0419   MCV 88.6 11/10/2024 0419   MCH 30.9 11/10/2024 0419   MCHC 34.8 11/10/2024 0419   RDW 13.0 11/10/2024 0419   LYMPHSABS 1.3 11/08/2024 2014   MONOABS 1.0 11/08/2024 2014   EOSABS 0.0 11/08/2024 2014   BASOSABS 0.0 11/08/2024 2014    BMET    Component Value Date/Time   NA 134 (L) 11/10/2024 0419   NA 137 10/13/2019 1112   K 3.8 11/10/2024 0419   CL 99 11/10/2024 0419   CO2 26 11/10/2024 0419   GLUCOSE 88 11/10/2024 0419   BUN 10 11/10/2024 0419   BUN 19 10/13/2019 1112   CREATININE 0.59 11/10/2024 0419   CALCIUM  8.6 (L) 11/10/2024 0419   GFRNONAA >60 11/10/2024 0419   GFRAA 87 10/13/2019 1112    INR    Component Value Date/Time   INR 1.0 11/07/2024 2055     Intake/Output Summary (Last 24 hours) at 11/10/2024 9177 Last data filed at 11/09/2024 1529 Gross per 24 hour  Intake 779.39 ml  Output 180 ml  Net 599.39 ml     Assessment/Plan:  69 y.o. female is s/p aortobifemoral bypass with bilateral common femoral artery endarterectomies 3 Days Post-Op   Subjectively doing well.  Bilateral lower extremities are well-perfused with  palpable pedal pulses.  All incisions are well-appearing without drainage.  Patient states she has been passing gas last night and overnight but no bowel movement yet.  She has not been nauseous since NG was pulled.  Will discuss advancing diet with Dr. Lanis.  Continue to mobilize with therapy teams.  Home when tolerating a regular diet    Donnice Sender, [place] [facility] [phone] 11/10/2024 8:22 AM    "

## 2024-11-10 NOTE — Progress Notes (Signed)
 Mobility Specialist Progress Note;   11/10/24 0958  Mobility  Activity Ambulated with assistance  Level of Assistance Contact guard assist, steadying assist  Assistive Device Other (Comment) (IV pole/HHA)  Distance Ambulated (ft) 10 ft  Activity Response Tolerated well  Mobility Referral Yes  Mobility visit 1 Mobility  Mobility Specialist Start Time (ACUTE ONLY) [id number]  Mobility Specialist Stop Time (ACUTE ONLY) 1013  Mobility Specialist Time Calculation (min) (ACUTE ONLY) 15 min   Pt calling for assistance from BR. Pt had BM and was needing assistance w/ pericare. Transferred pt to chair in order to complete linen change and clean up. Required MinG assistance during mobility w/ pt using IV pole and HHA as assistive devices. HR up to 122 bpm w/ activity. Deferred further mobility d/t meal tray. Pt requesting to sit on EoB to eat breakfast. Pt left sitting on Eob with all needs met. RN and NT notified.   Lauraine Erm Mobility Specialist Please contact via SecureChat or [employer] [phone]

## 2024-11-10 NOTE — TOC Initial Note (Signed)
 Transition of Care (TOC) - Initial/Assessment Note  Rayfield Gobble RN, BSN Inpatient Care Management Unit 4E- RN Case Manager See Treatment Team for direct phone #   Patient Details  Name: Kathleen Moyer MRN: [id number] Date of Birth: 09/29/1956  Transition of Care [facility] CM/SW Contact:    Gobble Rayfield Hurst, RN Phone Number: 11/10/2024, 4:36 PM  Clinical Narrative:                 Pt s/p ABFB, from home. Noted orders for [facility] and DME. CM notified by [facility] liaison that [facility] office made pre-op referral for [facility] needs.   CM in to speak with pt at bedside- discussed [facility] recs and order- pt voiced she is not sure she will need HH at discharge. Explained [facility] office referral to [facility]- pt agreeable to have liaison contact her post discharge to follow up for [facility] needs.   Pt confirmed she needs RW for home, no preference on provider, CM will refer out to agency to have delivered to pt prior to discharge - referral sent to [facility]  [facility] liaison updated to contact pt post discharge to follow up on [facility] needs- order for HHPT if pt agreeable.     Expected Discharge Plan: Home w Home Health Services Barriers to Discharge: Continued Medical Work up   Patient Goals and CMS Choice Patient states their goals for this hospitalization and ongoing recovery are:: return home   Choice offered to / list presented to : Patient      Expected Discharge Plan and Services   Discharge Planning Services: CM Consult Post Acute Care Choice: Home Health, Durable Medical Equipment Living arrangements for the past 2 months: Mobile Home                 DME Arranged: Walker rolling         HH Arranged: PT HH Agency: [facility] ([facility]) Date HH Agency Contacted: 11/10/24 Time HH Agency Contacted: 1636 Representative spoke with at [facility] Agency: [place]  Prior Living Arrangements/Services Living arrangements for the past 2 months: Mobile Home Lives with:: Self Patient language and  need for interpreter reviewed:: Yes Do you feel safe going back to the place where you live?: Yes      Need for Family Participation in Patient Care: Yes (Comment) Care giver support system in place?: Yes (comment)   Criminal Activity/Legal Involvement Pertinent to Current Situation/Hospitalization: No - Comment as needed  Activities of Daily Living   ADL Screening (condition at time of admission) Independently performs ADLs?: Yes (appropriate for developmental age) Is the patient deaf or have difficulty hearing?: No Does the patient have difficulty seeing, even when wearing glasses/contacts?: No Does the patient have difficulty concentrating, remembering, or making decisions?: No  Permission Sought/Granted Permission sought to share information with : Oceanographer granted to share information with : Yes, Verbal Permission Granted     Permission granted to share info w AGENCY: HH/DME        Emotional Assessment Appearance:: Appears stated age Attitude/Demeanor/Rapport: Engaged   Orientation: : Oriented to Self, Oriented to Place, Oriented to  Time, Oriented to Situation Alcohol / Substance Use: Not Applicable Psych Involvement: No (comment)  Admission diagnosis:  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, [place] - [address] [facility] [address] [place] [address] Phone: [phone] Fax: [phone]  [facility] Delivery) - [place], [place]  - [address] [facility] [address] [place] [address] [place] [address] Phone: [phone] Fax: [phone]  [facility] Delivery - [place], [place] - [address] [place] [address] Phone: [phone] Fax: [phone]  [place] - [facility] [address] [place] [address] Phone: [phone] Fax: [phone]     Social Drivers of Health (SDOH) Social History: SDOH Screenings   Food Insecurity: No Food Insecurity (11/07/2024)  Housing: Low Risk (11/07/2024)  Transportation Needs: No Transportation Needs (11/07/2024)  Utilities: Not At Risk (11/07/2024)  Social Connections: Unknown (11/08/2024)  Tobacco Use: High Risk (11/07/2024)   SDOH Interventions:     Readmission Risk Interventions     No data to display

## 2024-11-10 NOTE — Progress Notes (Signed)
 Physical Therapy Treatment Patient Details Name: Kathleen Moyer MRN: [id number] DOB: 01/09/1956 Today's Date: 11/10/2024   History of Present Illness The pt is a 69 yo F presenting 1/5 for aortobifemoral bypass and bilateral common femoral endarterectomy due to severe PAD. PMH includes: tobacco use, HTN, PVD    PT Comments  The pt is making great functional progress as she was able to ambulate up to ~220 ft and navigate x4 stairs today. She does prefer to use a RW for pain management when ambulating, but is capable of ambulating without UE support without LOB. She could benefit from a RW to manage her pain initially upon d/c home. She reports her toes are not as sensitive as they had been prior to surgery, expressing great delight in her progress with this and with her endurance since surgery. Will continue to follow acutely.     If plan is discharge home, recommend the following: A little help with walking and/or transfers;A little help with bathing/dressing/bathroom;Assistance with cooking/housework;Assist for transportation;Help with stairs or ramp for entrance   Can travel by private vehicle        Equipment Recommendations  Rolling walker (2 wheels)    Recommendations for Other Services       Precautions / Restrictions Precautions Precautions: Fall (abdominal) Recall of Precautions/Restrictions: Intact Precaution/Restrictions Comments: bilateral groin incisions and abdominal incision Restrictions Weight Bearing Restrictions Per Provider Order: No     Mobility  Bed Mobility Overal bed mobility: Needs Assistance Bed Mobility: Sidelying to Sit, Rolling, Sit to Sidelying Rolling: Supervision Sidelying to sit: Min assist, Used rails, HOB elevated     Sit to sidelying: Contact guard assist, Min assist, HOB elevated, Used rails General bed mobility comments: Cued pt to transition sidelying <> sit to reduce abdominal pain with bed mobility. Pt needed extra time to lift her  legs back onto the bed with sit to sidelying, 1x needing minA at legs and 1x needing CGA for safety only. She needed extra time and minA HHA to pull her trunk up to sit from R sidelying, needing cues to bring her R elbow under her trunk to push up to sit.    Transfers Overall transfer level: Needs assistance Equipment used: Rolling walker (2 wheels) Transfers: Sit to/from Stand Sit to Stand: Contact guard assist           General transfer comment: Pt able to stand from EOB without LOB, CGA for safety    Ambulation/Gait Ambulation/Gait assistance: Contact guard assist Gait Distance (Feet): 220 Feet Assistive device: Rolling walker (2 wheels), None Gait Pattern/deviations: Step-through pattern, Decreased stride length Gait velocity: decreased Gait velocity interpretation: 1.31 - 2.62 ft/sec, indicative of limited community ambulator   General Gait Details: Pt utilizing a RW the majority of the time for abdominal pain management. She was capable of ambulating short distances without UE support. No LOB noted. CGA for safety   Stairs Stairs: Yes Stairs assistance: Contact guard assist Stair Management: One rail Left, Step to pattern, Forwards Number of Stairs: 4 General stair comments: Ascends and descends stairs with L handrail support and a step-to pattern. No LOB, CGA for safety   Wheelchair Mobility     Tilt Bed    Modified Rankin (Stroke Patients Only)       Balance Overall balance assessment: Needs assistance Sitting-balance support: No upper extremity supported Sitting balance-Leahy Scale: Good Sitting balance - Comments: No LOB sitting unsupported EOB while dynamically reaching at least min off COG   Standing balance support:  During functional activity, Bilateral upper extremity supported, No upper extremity supported Standing balance-Leahy Scale: Fair Standing balance comment: Able to ambulate short distances without UE support but benefits from RW for pain  management                            Communication Communication Communication: No apparent difficulties  Cognition Arousal: Alert Behavior During Therapy: WFL for tasks assessed/performed   PT - Cognitive impairments: No apparent impairments                         Following commands: Intact      Cueing Cueing Techniques: Verbal cues  Exercises      General Comments        Pertinent Vitals/Pain Pain Assessment Pain Assessment: Faces Faces Pain Scale: Hurts little more Pain Location: abdominal incision (intact) Pain Descriptors / Indicators: Discomfort, Grimacing, Guarding, Operative site guarding Pain Intervention(s): Limited activity within patient's tolerance, Monitored during session, Repositioned    Home Living                          Prior Function            PT Goals (current goals can now be found in the care plan section) Acute Rehab PT Goals Patient Stated Goal: to return home PT Goal Formulation: With patient Time For Goal Achievement: 11/22/24 Potential to Achieve Goals: Good Progress towards PT goals: Progressing toward goals    Frequency    Min 2X/week      PT Plan      Co-evaluation              AM-PAC PT 6 Clicks Mobility   Outcome Measure  Help needed turning from your back to your side while in a flat bed without using bedrails?: A Little Help needed moving from lying on your back to sitting on the side of a flat bed without using bedrails?: A Little Help needed moving to and from a bed to a chair (including a wheelchair)?: A Little Help needed standing up from a chair using your arms (e.g., wheelchair or bedside chair)?: A Little Help needed to walk in hospital room?: A Little Help needed climbing 3-5 steps with a railing? : A Little 6 Click Score: 18    End of Session Equipment Utilized During Treatment: Gait belt Activity Tolerance: Patient tolerated treatment well Patient left: with  call bell/phone within reach;in bed;with bed alarm set   PT Visit Diagnosis: Unsteadiness on feet (R26.81);Muscle weakness (generalized) (M62.81);Pain;Other abnormalities of gait and mobility (R26.89) Pain - part of body:  (abdomen)     Time: 8955-8942 PT Time Calculation (min) (ACUTE ONLY): 13 min  Charges:    $Gait Training: 8-22 mins PT General Charges $$ ACUTE PT VISIT: 1 Visit                     Theo Ferretti, PT, DPT Acute Rehabilitation Services  Office: [phone]    Theo CHRISTELLA Ferretti 11/10/2024, 1:28 PM

## 2024-11-11 LAB — BPAM RBC
Blood Product Expiration Date: 202601192359
Blood Product Expiration Date: 202601212359
Blood Product Expiration Date: 202601272359
Blood Product Expiration Date: 202601272359
Blood Product Expiration Date: 202601272359
Blood Product Expiration Date: 202601272359
ISSUE DATE / TIME: 202601050800
ISSUE DATE / TIME: 202601050800
ISSUE DATE / TIME: 202601072311
ISSUE DATE / TIME: 202601080245
Unit Type and Rh: 600
Unit Type and Rh: 600
Unit Type and Rh: 600
Unit Type and Rh: 600
Unit Type and Rh: 600
Unit Type and Rh: 600

## 2024-11-11 LAB — TYPE AND SCREEN
ABO/RH(D): A NEG
Antibody Screen: NEGATIVE
Unit division: 0
Unit division: 0
Unit division: 0
Unit division: 0
Unit division: 0
Unit division: 0

## 2024-11-11 NOTE — Progress Notes (Signed)
 Occupational Therapy Treatment and Discharge Patient Details Name: Kathleen Moyer MRN: [id number] DOB: 03-21-1956 Today's Date: 11/11/2024   History of present illness The pt is a 69 yo F presenting 1/5 for aortobifemoral bypass and bilateral common femoral endarterectomy due to severe PAD. PMH includes: tobacco use, HTN, PVD   OT comments  Pt is routinely ambulating to bathroom (short distances) mod I, benefits from RW for longer distances. Able to stand from standard toilet with use of grab bar, declining 3 in 1 for home. Pt demonstrated ability to perform LB ADLs using figure 4 method and endorses minimal incisional pain. Pt will have assistance of her family for heavy lifting and housekeeping. No further OT needs.       If plan is discharge home, recommend the following:  Assistance with cooking/housework;Assist for transportation   Equipment Recommendations  None recommended by OT    Recommendations for Other Services      Precautions / Restrictions Precautions Precautions: Fall;Other (comment) (abdominal) Recall of Precautions/Restrictions: Intact Precaution/Restrictions Comments: bilateral groin incisions and abdominal incision Restrictions Weight Bearing Restrictions Per Provider Order: No       Mobility Bed Mobility               General bed mobility comments: seated EOB, endorses using log roll technique to achieve sitting up    Transfers Overall transfer level: Modified independent Equipment used: None                     Balance Overall balance assessment: Needs assistance   Sitting balance-Leahy Scale: Good     Standing balance support: During functional activity, Bilateral upper extremity supported, No upper extremity supported Standing balance-Leahy Scale: Fair Standing balance comment: walking to bathroom and sink without AD, no LOB                           ADL either performed or assessed with clinical judgement   ADL  Overall ADL's : Modified independent                                       General ADL Comments: able to perform figure 4 to reach feet for bathing and dressing    Extremity/Trunk Assessment              Vision       Perception     Praxis     Communication Communication Communication: No apparent difficulties   Cognition Arousal: Alert Behavior During Therapy: WFL for tasks assessed/performed Cognition: No apparent impairments                               Following commands: Intact        Cueing   Cueing Techniques: Verbal cues  Exercises      Shoulder Instructions       General Comments      Pertinent Vitals/ Pain       Pain Assessment Pain Assessment: Faces Faces Pain Scale: Hurts a little bit Pain Location: abdominal incision (intact) Pain Descriptors / Indicators: Discomfort, Grimacing, Guarding, Operative site guarding Pain Intervention(s): Monitored during session, Repositioned  Home Living  Prior Functioning/Environment              Frequency           Progress Toward Goals  OT Goals(current goals can now be found in the care plan section)  Progress towards OT goals: Goals met/education completed, patient discharged from OT     Plan      Co-evaluation                 AM-PAC OT 6 Clicks Daily Activity     Outcome Measure   Help from another person eating meals?: None Help from another person taking care of personal grooming?: None Help from another person toileting, which includes using toliet, bedpan, or urinal?: None Help from another person bathing (including washing, rinsing, drying)?: None Help from another person to put on and taking off regular upper body clothing?: None Help from another person to put on and taking off regular lower body clothing?: None 6 Click Score: 24    End of Session    OT Visit Diagnosis:  Pain   Activity Tolerance Patient tolerated treatment well   Patient Left in bed;with call bell/phone within reach;with bed alarm set   Nurse Communication          Time: 9142-9088 OT Time Calculation (min): 14 min  Charges: OT General Charges $OT Visit: 1 Visit OT Treatments $Self Care/Home Management : 8-22 mins  Mliss HERO, OTR/L Acute Rehabilitation Services Office: [phone]  Kennth Mliss Helling 11/11/2024, 10:59 AM

## 2024-11-11 NOTE — Plan of Care (Signed)

## 2024-11-11 NOTE — Progress Notes (Addendum)
" °  Progress Note    11/11/2024 8:05 AM 4 Days Post-Op  Subjective:  no complaints   Vitals:   11/10/24 1640 11/10/24 1918  BP: (!) 119/95 124/63  Pulse: 100 91  Resp: 19 20  Temp: 98.5 F (36.9 C) 98.4 F (36.9 C)  SpO2: 97% 100%   Physical Exam: Lungs:  non labored Incisions:  abd and groin incisions c/d/i Extremities:  palpable pedal pulses Abdomen:  soft, NT, ND Neurologic: A&O  CBC    Component Value Date/Time   WBC 10.2 11/10/2024 0419   RBC 3.24 (L) 11/10/2024 0419   HGB 10.0 (L) 11/10/2024 0419   HCT 28.7 (L) 11/10/2024 0419   PLT 136 (L) 11/10/2024 0419   MCV 88.6 11/10/2024 0419   MCH 30.9 11/10/2024 0419   MCHC 34.8 11/10/2024 0419   RDW 13.0 11/10/2024 0419   LYMPHSABS 1.3 11/08/2024 2014   MONOABS 1.0 11/08/2024 2014   EOSABS 0.0 11/08/2024 2014   BASOSABS 0.0 11/08/2024 2014    BMET    Component Value Date/Time   NA 134 (L) 11/10/2024 0419   NA 137 10/13/2019 1112   K 3.8 11/10/2024 0419   CL 99 11/10/2024 0419   CO2 26 11/10/2024 0419   GLUCOSE 88 11/10/2024 0419   BUN 10 11/10/2024 0419   BUN 19 10/13/2019 1112   CREATININE 0.59 11/10/2024 0419   CALCIUM  8.6 (L) 11/10/2024 0419   GFRNONAA >60 11/10/2024 0419   GFRAA 87 10/13/2019 1112    INR    Component Value Date/Time   INR 1.0 11/07/2024 2055    No intake or output data in the 24 hours ending 11/11/24 0805   Assessment/Plan:  69 y.o. female is s/p ABF 4 Days Post-Op   Subjectively doing well.  BLE well perfused with palpable pedal pulses.  Incisions are healing well.  She had 2 BM yesterday.  Continue regular diet.  She has passed PT/OT.  Probable discharge home tomorrow.  Office will arrange follow up in 2-3 weeks.   Donnice Sender, PA-C [facility] [phone] 11/11/2024 8:05 AM  VASCULAR STAFF ADDENDUM: I have independently interviewed and examined the patient. I agree with the above.  Looks great. Regular diet today.  Home tomorrow pending no  issues.   Fonda FORBES Rim MD [facility] Phone Number: [phone] 11/11/2024 10:11 AM    "

## 2024-11-11 NOTE — Progress Notes (Signed)
 Mobility Specialist Progress Note;   11/11/24 0948  Mobility  Activity Ambulated with assistance  Level of Assistance Standby assist, set-up cues, supervision of patient - no hands on  Assistive Device Front wheel walker  Distance Ambulated (ft) 200 ft  Activity Response Tolerated well  Mobility Referral Yes  Mobility visit 1 Mobility  Mobility Specialist Start Time (ACUTE ONLY) [id number]  Mobility Specialist Stop Time (ACUTE ONLY) 1001  Mobility Specialist Time Calculation (min) (ACUTE ONLY) 13 min   Pt agreeable to mobility. Required no physical assistance during ambulation, SV for safety. Able to ambulate w/ AD and w/o. HR up to 120 bpm w/ exertion. No c/o when asked. Pt returned back to bed and left with all needs met. RN in room.   Lauraine Erm Mobility Specialist Please contact via SecureChat or [employer] [phone]

## 2024-11-12 ENCOUNTER — Other Ambulatory Visit (HOSPITAL_COMMUNITY): Payer: Self-pay

## 2024-11-12 MED ORDER — AMOXICILLIN-POT CLAVULANATE 875-125 MG PO TABS
1.0000 | ORAL_TABLET | Freq: Two times a day (BID) | ORAL | 0 refills | Status: AC
  Filled 2024-11-12: qty 28, 14d supply, fill #0

## 2024-11-12 MED ORDER — OXYCODONE-ACETAMINOPHEN 5-325 MG PO TABS
1.0000 | ORAL_TABLET | Freq: Four times a day (QID) | ORAL | 0 refills | Status: AC | PRN
  Filled 2024-11-12: qty 20, 5d supply, fill #0

## 2024-11-12 MED ORDER — ROSUVASTATIN CALCIUM 20 MG PO TABS
20.0000 mg | ORAL_TABLET | Freq: Every day | ORAL | 2 refills | Status: AC
  Filled 2024-11-12: qty 30, 30d supply, fill #0

## 2024-11-12 NOTE — Progress Notes (Signed)
" °  Progress Note    11/12/2024 8:25 AM 5 Days Post-Op  Subjective:  No complaints   Vitals:   11/12/24 0317 11/12/24 0754  BP: (!) 148/69 134/66  Pulse: 90 87  Resp: 20 20  Temp: 98.2 F (36.8 C) 98.4 F (36.9 C)  SpO2: 94% 98%   Physical Exam: Lungs:  non labored Incisions:  abd and groin incisions c/d/i Extremities:  palpable pedal pulses Abdomen:  soft, NT, ND Neurologic: A&O  CBC    Component Value Date/Time   WBC 10.2 11/10/2024 0419   RBC 3.24 (L) 11/10/2024 0419   HGB 10.0 (L) 11/10/2024 0419   HCT 28.7 (L) 11/10/2024 0419   PLT 136 (L) 11/10/2024 0419   MCV 88.6 11/10/2024 0419   MCH 30.9 11/10/2024 0419   MCHC 34.8 11/10/2024 0419   RDW 13.0 11/10/2024 0419   LYMPHSABS 1.3 11/08/2024 2014   MONOABS 1.0 11/08/2024 2014   EOSABS 0.0 11/08/2024 2014   BASOSABS 0.0 11/08/2024 2014    BMET    Component Value Date/Time   NA 134 (L) 11/10/2024 0419   NA 137 10/13/2019 1112   K 3.8 11/10/2024 0419   CL 99 11/10/2024 0419   CO2 26 11/10/2024 0419   GLUCOSE 88 11/10/2024 0419   BUN 10 11/10/2024 0419   BUN 19 10/13/2019 1112   CREATININE 0.59 11/10/2024 0419   CALCIUM  8.6 (L) 11/10/2024 0419   GFRNONAA >60 11/10/2024 0419   GFRAA 87 10/13/2019 1112    INR    Component Value Date/Time   INR 1.0 11/07/2024 2055    No intake or output data in the 24 hours ending 11/12/24 0825   Assessment/Plan:  69 y.o. female is s/p ABF 5 Days Post-Op   Subjectively doing well.  BLE well perfused with palpable pedal pulses.  Incisions are healing well.  She had 2 BM yesterday.  Continue regular diet.  She has passed PT/OT.  Discharge home today Can add 2.5 Xarelto -home dose 2 weeks oral antibiotics due to toe infection.  Office will arrange follow up in 2-3 weeks.       "

## 2024-11-12 NOTE — Progress Notes (Signed)
 DISCHARGE NOTE HOME Kathleen Moyer Shoulder to be discharged Home per MD order. Discussed prescriptions and follow up appointments with the patient. Prescriptions given to patient; medication list explained in detail. Patient verbalized understanding.  Skin clean, dry and intact without evidence of skin break down, no evidence of skin tears noted. IV catheter discontinued intact. Site without signs and symptoms of complications. Dressing and pressure applied. Pt denies pain at the site currently. No complaints noted.  See LDA for sugical incisions at discharge Patient free of lines, drains, and wounds.   An After Visit Summary (AVS) was printed and given to the patient. Patient escorted via wheelchair, to duischarge lounge to wait for ride and discharged home via private auto.  Peyton SHAUNNA Pepper, RN

## 2024-11-15 NOTE — Discharge Summary (Signed)
 " Aorta Discharge Summary    Fredricka Kohrs [facility] 1956-09-11 69 y.o. female  [id number]  Admission Date: 11/07/2024  Discharge Date: 11/12/24  Physician: Dr. 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 Ferrah Panagopoulos is a 69 year old female who bilateral lower extremity critical limb ischemia with tissue loss on the right and rest pain on the left.  She was brought to the operating room by Dr. Silver on 11/07/2024 and underwent aortobifemoral bypass with ligation of the IMA and bilateral common femoral endarterectomies.  She tolerated the procedure well and was admitted to the ICU postoperatively.  Critical care was consulted for help with medical management while in the ICU.  She was transfused 2 units postoperatively and hemoglobin and hematocrit responded appropriately.  She was transferred out of the ICU after 48 hours.  Throughout her hospital stay she maintained pedal pulses.  She progressed quickly with her diet as well as her mobility.  At the time of discharge her abdomen and groin incisions were well-appearing.  She was tolerating a regular diet and having regular bowel movements.  She was prescribed 2 weeks of Augmentin  for tissue loss of the right great toe and second toe.  She was also prescribed narcotic pain medication for continued postoperative pain control.  She will follow-up in the office in 2 to 3 weeks for incision checks.  She was provided a rolling walker and the transition of care team also arranged home health physical therapy for after discharge.  She was discharged home in stable condition.  CBC    Component Value Date/Time   WBC 10.2 11/10/2024 0419   RBC 3.24 (L) 11/10/2024 0419   HGB 10.0 (L) 11/10/2024 0419    HCT 28.7 (L) 11/10/2024 0419   PLT 136 (L) 11/10/2024 0419   MCV 88.6 11/10/2024 0419   MCH 30.9 11/10/2024 0419   MCHC 34.8 11/10/2024 0419   RDW 13.0 11/10/2024 0419   LYMPHSABS 1.3 11/08/2024 2014   MONOABS 1.0 11/08/2024 2014   EOSABS 0.0 11/08/2024 2014   BASOSABS 0.0 11/08/2024 2014    BMET    Component Value Date/Time   NA 134 (L) 11/10/2024 0419   NA 137 10/13/2019 1112   K 3.8 11/10/2024 0419   CL 99 11/10/2024 0419   CO2 26 11/10/2024 0419   GLUCOSE 88 11/10/2024 0419   BUN 10 11/10/2024 0419   BUN 19 10/13/2019 1112   CREATININE 0.59 11/10/2024 0419   CALCIUM  8.6 (L) 11/10/2024 0419   GFRNONAA >60 11/10/2024 0419   GFRAA 87 10/13/2019 1112       Discharge Diagnosis:  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 (HCC) [I70.235] Critical limb ischemia of right lower extremity (HCC) [I70.221] Aortoiliac occlusive disease (HCC) [I74.09]  Secondary Diagnosis: Patient Active Problem List   Diagnosis Date Noted   Critical limb ischemia of right lower extremity (HCC) 11/07/2024   Aortoiliac occlusive disease (HCC) 11/07/2024   Essential hypertension 08/30/2019   Pure hypercholesterolemia 08/30/2019   Past Medical History:  Diagnosis Date   Elevated fasting glucose    Hypercholesteremia    Hypertension    Peripheral vascular disease      Allergies as of 11/12/2024       Reactions   Indocin [indomethacin] Diarrhea, Nausea  Only   Pt denies        Medication List     TAKE these medications    amoxicillin -clavulanate 875-125 MG tablet Commonly known as: AUGMENTIN  Take 1 tablet by mouth 2 (two) times daily for 14 days.   aspirin  81 MG chewable tablet Chew by mouth once.   ferrous sulfate 324 MG Tbec Take 324 mg by mouth daily.   hydrochlorothiazide  12.5 MG tablet Commonly known as: HYDRODIURIL  Take 12.5 mg by mouth daily.   Nexletol 180 MG Tabs Generic drug: Bempedoic Acid Take 1 tablet by mouth daily.    oxyCODONE -acetaminophen  5-325 MG tablet Commonly known as: PERCOCET/ROXICET Take 1 tablet by mouth every 6 (six) hours as needed for severe pain (pain score 7-10). What changed: when to take this   rosuvastatin  20 MG tablet Commonly known as: CRESTOR  Take 1 tablet (20 mg total) by mouth daily.   telmisartan  40 MG tablet Commonly known as: MICARDIS  Take 40 mg by mouth daily.   Xarelto  2.5 MG Tabs tablet Generic drug: rivaroxaban  Take 1 tablet (2.5 mg total) by mouth 2 (two) times daily.        Instructions:  [facility] Discharge Instructions Open Aortic Surgery  Please refer to the following instructions for your post-procedure care. Your surgeon or Physician Assistant will discuss any changes with you.  Activity  Avoid lifting more than eight pounds (a gallon of milk) until after your first post-operative visit. You are encouraged to walk as much as you can. You can slowly return to normal activities but must avoid strenuous activity and heavy lifting until your doctor tells you it's OK. Heavy lifting can hurt the incision and cause a hernia. Avoid activities such as vacuuming or swinging a golf club. It is normal to feel tired for several weeks after your surgery. Do not drive until your doctor gives the OK and you are no longer taking prescription pain medications. It is also normal to have difficulty with sleep habits, eating and bowl movements after surgery. These will go away with time.  Bathing/Showering  You may shower after you go home. Do not soak in a bathtub, hot tub, or swim until the incision heals.  Incision Care  Shower every day. Clean your incision with mild soap and water. Pat the area dry with a clean towel. You do not need a bandage unless otherwise instructed. Do not apply any ointments or creams to your incision. You may have skin glue on your incision. Do not peel it off. It will come off on its own in about one week. If  you have staples or sutures along your incision, they will be removed at your post op appointment.  If you have groin incisions, wash the groin wounds with soap and water daily and pat dry. (No tub bath-only shower)  Then put a dry gauze or washcloth in the groin to keep this area dry to help prevent wound infection.  Do this daily and as needed.  Do not use Vaseline or neosporin on your incisions.  Only use soap and water on your incisions and then protect and keep dry.  Diet  Resume your normal diet. There are no special food restriction following this procedure. A low fat/low cholesterol diet is recommended for all patients with vascular disease. After your aortic surgery, it's normal to feel full faster than usual and to not feel as hungry as you normally would. You will probably lose weight initially following your surgery.  It's best to eat small, frequent meals over the course of the day. Call the office if you find that you are unable to eat even small meals. In order to heal from your surgery, it is CRITICAL to get adequate nutrition. Your body requires vitamins, minerals, and protein. Vegetables are the best source of vitamins and minerals. causing pain, you may take over-the-counter pain reliever such as acetaminophen  (Tylenol ). If you were prescribed a stronger pain medication, please be aware these medication can cause nausea and constipation. Prevent nausea by taking the medication with a snack or meal. Avoid constipation by drinking plenty of fluids and eating foods with a high amount of fiber, such as fruits, vegetables and grains. Take 100mg  of the over-the-counter stool softener Colace twice a day as needed to help with constipation. A laxative, such as Milk of Magnesia, may be recommended for you at this time. Do not take a laxative unless your surgeon or Physician Assistant. tells you it's OK. Do not take Tylenol  if you are taking stronger pain medications (such as Percocet).  Follow  Up  Our office will schedule a follow up appointment 2-3 weeks after discharge.  Please call us  immediately for any of the following conditions    .     Severe or worsening pain in your legs or feet or in your abdomen back or chest. Increased pain, redness drainage (pus) from your incision site. Increased abdominal pain, bloating, nausea, vomiting, or persistent diarrhea. Fever of 101 degrees or higher. Swelling in your leg (s).  Reduce your risk of vascular disease  Stop smoking. If you would like help, call [facility] at 1-800-QUIT-NOW ((262)584-3867) or  at [phone]. Manage your cholesterol Maintain a desired weight Control your diabetes Keep your blood pressure down  If you have any questions please call the office at [phone].   Disposition: home  Patient's condition: is Good  Follow up: 1. [facility] in 3 weeks   Donnice Sender, PA-C [facility] [phone] 11/15/2024  4:26 PM   - For VQI Registry use -  Post-op:  Time to Extubation: [x]  In OR, [ ]  < 12 hrs, [ ]  12-24 hrs, [ ]  >=24 hrs Vasopressors Req. Post-op: No ICU Stay: 2 days in ICU Transfusion: Yes   If yes, 2 units given MI: No, [ ]  Troponin only, [ ]  EKG or Clinical New Arrhythmia: No  Complications: CHF: No Resp failure: No, [ ]  Pneumonia, [ ]  Ventilator Chg in renal function: No, [ ]  Inc. Cr > 0.5, [ ]  Temp. Dialysis, [ ]  Permanent dialysis Leg ischemia: No, no Surgery needed, [ ]  Yes, Surgery needed, [ ]  Amputation Bowel ischemia: No, [ ]  Medical Rx, [ ]  Surgical Rx Wound complication: No, [ ]  Superficial separation/infection, [ ]  Return to OR Return to OR: No  Return to OR for bleeding: No Stroke: No, [ ]  Minor, [ ]  Major  Discharge medications: Statin use:  Yes  ASA use:  Yes   Plavix use:  No  Beta blocker use:  No  ACEI use:  No ARB use:  Yes CCB use:  No Coumadin use:  No   "

## 2024-12-01 ENCOUNTER — Ambulatory Visit: Attending: Surgery | Admitting: Physician Assistant

## 2024-12-01 VITALS — BP 111/72 | HR 102 | Temp 97.7°F | Ht 62.0 in | Wt 114.0 lb

## 2024-12-01 DIAGNOSIS — Z95828 Presence of other vascular implants and grafts: Secondary | ICD-10-CM

## 2024-12-01 DIAGNOSIS — I70222 Atherosclerosis of native arteries of extremities with rest pain, left leg: Secondary | ICD-10-CM

## 2024-12-01 DIAGNOSIS — I70235 Atherosclerosis of native arteries of right leg with ulceration of other part of foot: Secondary | ICD-10-CM

## 2024-12-01 NOTE — Progress Notes (Signed)
 " POST OPERATIVE OFFICE NOTE    CC:  F/u for surgery  HPI:  This is a 69 y.o. female who is s/p Aortobifemoral bypass 14 x 7 mm bifurcated dacron graft; Ligation of the inferior mesenteric artery; Bilateral common femoral endarterectomy and Right lower extremity diagnostic arteriogram on 11/07/24 by Dr. Lanis. This was performed secondary to bilateral lower extremity critical limb ischemia with rest pain on LLE and tissue loss on the RLE. She did very well post operatively and was discharged home POD#5.   Pt returns today for follow up incision check with her sister in law. Pt states overall she has been feeling good. Her rest pain is resolved. She right toe wounds are improving, still some tenderness . She completed her 2 week course of Augmentin . She says she is tolerating small meals. She gets full easily but has a good appetite. Her bowels are moving well and urinating without issues. She has been having some dizziness with low blood pressure. She takes her BP at home daily and her systolic's have been in the 90's and even 80's. She feels very fatigued/ tired when her blood pressure is low. She is medically managed on Aspirin , Statin and Xarelto .   Allergies[1]  Current Outpatient Medications  Medication Sig Dispense Refill   aspirin  81 MG chewable tablet Chew by mouth once.     ferrous sulfate 324 MG TBEC Take 324 mg by mouth daily.     hydrochlorothiazide  (HYDRODIURIL ) 12.5 MG tablet Take 12.5 mg by mouth daily.     NEXLETOL 180 MG TABS Take 1 tablet by mouth daily.     oxyCODONE -acetaminophen  (PERCOCET/ROXICET) 5-325 MG tablet Take 1 tablet by mouth every 6 (six) hours as needed for severe pain (pain score 7-10). 20 tablet 0   rivaroxaban  (XARELTO ) 2.5 MG TABS tablet Take 1 tablet (2.5 mg total) by mouth 2 (two) times daily. 60 tablet 3   rosuvastatin  (CRESTOR ) 20 MG tablet Take 1 tablet (20 mg total) by mouth daily. 30 tablet 2   telmisartan  (MICARDIS ) 40 MG tablet Take 40 mg by mouth  daily.     No current facility-administered medications for this visit.     ROS:  See HPI  Physical Exam:  Vitals:   12/01/24 1444  BP: 111/72  Pulse: (!) 102  Temp: 97.7 F (36.5 C)  SpO2: 98%    Incision:  Midline and bilateral groin incisions are intact and well appearing Extremities:  Left DP 2+ palpable, right AT palpable. Feet warm and well perfused. Right great toe and 2nd toe ulcerations as shown below.   Neuro: alert and oriented Abdomen:  soft, non distended, expected tenderness   Assessment/Plan:  This is a 69 y.o. female who is s/p: Aortobifemoral bypass 14 x 7 mm bifurcated dacron graft; Ligation of the inferior mesenteric artery; Bilateral common femoral endarterectomy and Right lower extremity diagnostic arteriogram on 11/07/24 by Dr. Lanis. She is doing great post operatively. Her incisions are healing very well. Her left foot rest pain is resolved. Her right great toe and 2nd toe are improving. She is tolerating a diet and moving her bowels well. No issues urinating.  - She can continue to increase her activity slowly - recommend that she take her BP at home prior to taking her medication and if her SBP < 100 to hold her medication. Also recommend she follow up with her PCP to discuss BP management as she may need to decrease her dose or stop one of her medications -  Continue Aspirin , statin, Xarelto   - Will make referral to Dr. Malvin for her right toe ulcerations - She will follow up with us  in 3 months with ABI. She knows to call for earlier follow up if any new or concerning symptoms  Curry Damme, [facility] [phone]   Clinic MD:  Lanis     [1]  Allergies Allergen Reactions   Amlodipine Besylate     Other Reaction(s): edema (05/2019)   Indocin [Indomethacin] Diarrhea and Nausea Only    Pt denies   Simvastatin      Other Reaction(s): elevated LFTs (2021)   Valsartan     Other Reaction(s): abd pain (06/2019)   "

## 2024-12-05 ENCOUNTER — Other Ambulatory Visit: Payer: Self-pay

## 2024-12-05 DIAGNOSIS — I70223 Atherosclerosis of native arteries of extremities with rest pain, bilateral legs: Secondary | ICD-10-CM

## 2024-12-13 ENCOUNTER — Ambulatory Visit: Admitting: Podiatry

## 2025-01-10 ENCOUNTER — Ambulatory Visit

## 2025-02-23 ENCOUNTER — Ambulatory Visit

## 2025-02-23 ENCOUNTER — Ambulatory Visit (HOSPITAL_COMMUNITY)

## 2025-03-02 ENCOUNTER — Other Ambulatory Visit (HOSPITAL_BASED_OUTPATIENT_CLINIC_OR_DEPARTMENT_OTHER)
